# Patient Record
Sex: Female | Born: 1975 | Race: White | Hispanic: No | Marital: Married | Smoking: Former smoker
Health system: Southern US, Community
[De-identification: ages and names within clinical notes are randomized; demographics above are authoritative.]

## PROBLEM LIST (undated history)

## (undated) DIAGNOSIS — I10 Essential (primary) hypertension: Secondary | ICD-10-CM

## (undated) DIAGNOSIS — F419 Anxiety disorder, unspecified: Secondary | ICD-10-CM

## (undated) DIAGNOSIS — F32A Depression, unspecified: Secondary | ICD-10-CM

## (undated) DIAGNOSIS — K219 Gastro-esophageal reflux disease without esophagitis: Secondary | ICD-10-CM

## (undated) DIAGNOSIS — E538 Deficiency of other specified B group vitamins: Secondary | ICD-10-CM

## (undated) DIAGNOSIS — E785 Hyperlipidemia, unspecified: Secondary | ICD-10-CM

---

## 1993-02-26 HISTORY — PX: ECTOPIC PREGNANCY SURGERY: SHX613

## 1999-02-27 HISTORY — PX: TUBAL LIGATION: SHX77

## 2005-02-26 HISTORY — PX: LAPAROSCOPIC ABDOMINAL EXPLORATION: SHX6249

## 2011-02-27 HISTORY — PX: ENDOMETRIAL ABLATION: SHX621

## 2014-09-09 DIAGNOSIS — F32A Depression, unspecified: Secondary | ICD-10-CM | POA: Insufficient documentation

## 2016-05-29 ENCOUNTER — Other Ambulatory Visit: Payer: Self-pay | Admitting: Obstetrics & Gynecology

## 2016-05-29 DIAGNOSIS — Z1231 Encounter for screening mammogram for malignant neoplasm of breast: Secondary | ICD-10-CM

## 2016-06-20 ENCOUNTER — Ambulatory Visit
Admission: RE | Admit: 2016-06-20 | Discharge: 2016-06-20 | Disposition: A | Discharge status: Home / Self Care | Payer: BC Managed Care – PPO | Source: Ambulatory Visit | Attending: Obstetrics & Gynecology | Admitting: Obstetrics & Gynecology

## 2016-06-20 DIAGNOSIS — Z1231 Encounter for screening mammogram for malignant neoplasm of breast: Secondary | ICD-10-CM | POA: Diagnosis present

## 2016-06-21 ENCOUNTER — Encounter: Payer: Self-pay | Admitting: Obstetrics & Gynecology

## 2016-06-25 ENCOUNTER — Encounter: Payer: Self-pay | Admitting: Obstetrics & Gynecology

## 2016-06-25 ENCOUNTER — Ambulatory Visit (INDEPENDENT_AMBULATORY_CARE_PROVIDER_SITE_OTHER): Payer: BC Managed Care – PPO | Admitting: Obstetrics & Gynecology

## 2016-06-25 VITALS — BP 130/82 | HR 93 | Ht 66.0 in | Wt 192.0 lb

## 2016-06-25 DIAGNOSIS — N870 Mild cervical dysplasia: Secondary | ICD-10-CM

## 2016-06-25 NOTE — Patient Instructions (Signed)
Cervical Dysplasia Cervical dysplasia is a condition in which a woman's cervix cells have abnormal changes. The cervix is the opening of the uterus (womb). It is located between the vagina and the uterus. Cervical dysplasia may be an early sign of cervical cancer. If left untreated, this condition may become more severe and may progress to cervical cancer. Early detection, treatment, and follow-up care are very important. What are the causes? Cervical dysplasia can be caused by a human papillomavirus (HPV) infection. HPV is the most common sexually transmitted infection (STI). HPV is spread from person to person through sexual contact. This includes oral, vaginal, or anal sex. What increases the risk? The following factors may make you more likely to develop this condition:  Having had a sexually transmitted infection (STI), such as herpes, chlamydia or HPV.  Becoming sexually active before age 18.  Having had more than one sexual partner.  Having a sexual partner who has multiple sexual partners.  Not using protection, such as a condom, during sex, especially with new sexual partners.  Having a history of cancer of the vagina or vulva.  Having a weakened body defense (immune) system.  Being the daughter of a woman who took diethylstilbestrol (DES) during pregnancy.  Having a family history of cervical cancer.  Smoking.  Using oral contraceptives, also called birth control pills.  Having had three or more full-term pregnancies.  What are the signs or symptoms? There are usually no symptoms of this condition. If you do have symptoms, they may include:  Abnormal vaginal discharge.  Bleeding between periods or after sex.  Bleeding during menopause.  Pain during sex (dyspareunia).  How is this diagnosed? A test called a Pap test may be done. During this test, cells are taken from the cervix and then examined under a microscope. A test in which tissue is removed from the cervix  (biopsy) may also be done if the Pap test is abnormal or if the cervix looks abnormal. How is this treated? Treatment varies based on the severity of the condition. Treatment may include:  Cryotherapy. During cryotherapy, the abnormal cells are frozen with a steel-tip instrument.  Loop electrosurgical excision procedure (LEEP). This procedure removes abnormal tissue from the cervix.  Surgery to remove abnormal tissue. This is usually done in more advanced cases. Surgical options include: ? A cone biopsy. This is a procedure in which the cervical canal and a portion of the center of the cervix are removed. ? Hysterectomy. This is a surgery in which the uterus and cervix are removed.  Follow these instructions at home:  Take over-the-counter and prescription medicines only as told by your health care provider.  Do not use tampons, have sex, or douche until your health care provider says it is okay.  Keep follow-up visits as told by your health care provider. This is important. Women who have been treated for cervical dysplasia should have regular pelvic exams and Pap tests as told by their health care provider. How is this prevented?  Practice safe sex to help prevent sexually transmitted infections (STI) that may cause this condition.  Have regular Pap tests. Talk with your health care provider about how often you need these tests. Pap tests will help identify cell changes that can lead to cancer. Contact a health care provider if:  You develop genital warts. Get help right away if:  You have a fever.  You have abnormal vaginal discharge.  Your menstrual period is heavier than normal.  You develop bright   red bleeding. This may include blood clots.  You have pain or cramps that get worse, and medicine does not help to relieve your pain.  You feel light-headed and you are unusually weak.  You have fainting spells.  You have pain in the abdomen. Summary  Cervical dysplasia  is a condition in which a woman's cervix cells have abnormal changes.  If left untreated, this condition may become more severe and may progress to cervical cancer.  Early detection, treatment, and follow-up care are very important in managing this condition.  Have regular pelvic exams and Pap tests. Talk with your health care provider about how often you need these tests. Pap tests will help identify cell changes that can lead to cancer. This information is not intended to replace advice given to you by your health care provider. Make sure you discuss any questions you have with your health care provider. Document Released: 02/12/2005 Document Revised: 02/16/2016 Document Reviewed: 02/16/2016 Elsevier Interactive Patient Education  2017 [employer]  

## 2016-06-25 NOTE — Progress Notes (Signed)
  HPI:  Patient is a 41 y.o. No obstetric history on file. presenting for follow up evaluation of abnormal PAP smear in the past.  Her last PAP was 6 months ago and was abnormal: LGSIL. She has had a prior colposcopy. 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  PMHx: She  has no past medical history on file. Also,  has no past surgical history on file., family history includes Breast cancer in her paternal grandmother; Prostate cancer in her father.,  reports that she has never smoked. She has never used smokeless tobacco. She reports that she does not drink alcohol or use drugs.  She currently has no medications in their medication list. Also, is allergic to sulfa antibiotics.  Review of Systems  Constitutional: Negative for chills, fever and malaise/fatigue.  HENT: Negative for congestion, sinus pain and sore throat.   Eyes: Negative for blurred vision and pain.  Respiratory: Negative for cough and wheezing.   Cardiovascular: Negative for chest pain and leg swelling.  Gastrointestinal: Negative for abdominal pain, constipation, diarrhea, heartburn, nausea and vomiting.  Genitourinary: Negative for dysuria, frequency, hematuria and urgency.  Musculoskeletal: Negative for back pain, joint pain, myalgias and neck pain.  Skin: Negative for itching and rash.  Neurological: Negative for dizziness, tremors and weakness.  Endo/Heme/Allergies: Does not bruise/bleed easily.  Psychiatric/Behavioral: Negative for depression. The patient is not nervous/anxious and does not have insomnia.     Objective: BP 130/82 (BP Location: Right Arm, Patient Position: Sitting, Cuff Size: Normal)   Pulse 93   Ht 5\' 6"  (1.676 m)   Wt 192 lb (87.1 kg)   BMI 30.99 kg/m  Filed Weights   06/25/16 1628  Weight: 192 lb (87.1 kg)   Body mass index is 30.99 kg/m.  Physical examination Physical Exam  Constitutional: She is oriented to person, place, and time. She appears well-developed and well-nourished. No distress.    Genitourinary: Vagina normal and uterus normal. Pelvic exam was performed with patient supine. There is no rash, tenderness or lesion on the right labia. There is no rash, tenderness or lesion on the left labia. No erythema or bleeding in the vagina. Right adnexum does not display mass and does not display tenderness. Left adnexum does not display mass and does not display tenderness. Cervix does not exhibit motion tenderness, discharge, polyp or nabothian cyst.   Uterus is mobile and midaxial. Uterus is not enlarged or exhibiting a mass.  Abdominal: Soft. She exhibits no distension. There is no tenderness.  Musculoskeletal: Normal range of motion.  Neurological: She is alert and oriented to person, place, and time. No cranial nerve deficit.  Skin: Skin is warm and dry.  Psychiatric: She has a normal mood and affect.    ASSESSMENT:  History of Cervical Dysplasia  Plan:  1.  I discussed the grading system of pap smears and HPV high risk viral types.   2. Follow up PAP 6 months, vs intervention if high grade dysplasia identified. 3. Treatment of persistantly abnormal PAP smears and cervical dysplasia, even mild, is discussed w pt today in detail, as well as the pros and cons of Cryo and LEEP procedures. Will consider and discuss after results.   Barnett Applebaum, MD, [facility], [facility] 06/25/2016  4:47 PM

## 2016-06-29 LAB — IGP, RFX APTIMA HPV ASCU: PAP Smear Comment: 0

## 2017-02-26 DIAGNOSIS — R7303 Prediabetes: Secondary | ICD-10-CM

## 2017-02-26 HISTORY — DX: Prediabetes: R73.03

## 2017-05-08 ENCOUNTER — Other Ambulatory Visit: Payer: Self-pay | Admitting: Obstetrics & Gynecology

## 2017-05-08 DIAGNOSIS — Z1239 Encounter for other screening for malignant neoplasm of breast: Secondary | ICD-10-CM

## 2017-06-21 ENCOUNTER — Ambulatory Visit
Admission: RE | Admit: 2017-06-21 | Discharge: 2017-06-21 | Disposition: A | Discharge status: Home / Self Care | Payer: BC Managed Care – PPO | Source: Ambulatory Visit | Attending: Obstetrics & Gynecology | Admitting: Obstetrics & Gynecology

## 2017-06-21 DIAGNOSIS — Z1239 Encounter for other screening for malignant neoplasm of breast: Secondary | ICD-10-CM

## 2017-06-21 DIAGNOSIS — Z1231 Encounter for screening mammogram for malignant neoplasm of breast: Secondary | ICD-10-CM | POA: Insufficient documentation

## 2017-07-30 ENCOUNTER — Other Ambulatory Visit: Payer: Self-pay | Admitting: Obstetrics and Gynecology

## 2018-05-08 ENCOUNTER — Other Ambulatory Visit: Payer: Self-pay | Admitting: Student

## 2018-05-08 ENCOUNTER — Other Ambulatory Visit: Payer: Self-pay | Admitting: Family Medicine

## 2018-05-08 DIAGNOSIS — Z1231 Encounter for screening mammogram for malignant neoplasm of breast: Secondary | ICD-10-CM

## 2018-10-08 ENCOUNTER — Ambulatory Visit
Admission: RE | Admit: 2018-10-08 | Discharge: 2018-10-08 | Disposition: A | Discharge status: Home / Self Care | Payer: BC Managed Care – PPO | Source: Ambulatory Visit | Attending: Student | Admitting: Student

## 2018-10-08 ENCOUNTER — Other Ambulatory Visit: Payer: Self-pay

## 2018-10-08 DIAGNOSIS — Z1231 Encounter for screening mammogram for malignant neoplasm of breast: Secondary | ICD-10-CM | POA: Diagnosis not present

## 2018-11-10 ENCOUNTER — Telehealth: Payer: Self-pay | Admitting: Obstetrics & Gynecology

## 2018-11-10 NOTE — Telephone Encounter (Signed)
Received records from [facility] called and left message for patient to call the office back to make appointment.

## 2018-11-20 ENCOUNTER — Ambulatory Visit: Payer: BC Managed Care – PPO | Admitting: Obstetrics & Gynecology

## 2018-11-21 NOTE — Telephone Encounter (Signed)
Patient cancelled appointment schedule for 11/20/18. Called and left voicemail for patient to call back to be reschedule. Paper records

## 2018-11-24 NOTE — Telephone Encounter (Signed)
Called and left voice mail for patient to call back to be schedule °

## 2019-10-27 DIAGNOSIS — I10 Essential (primary) hypertension: Secondary | ICD-10-CM | POA: Insufficient documentation

## 2019-10-27 DIAGNOSIS — K219 Gastro-esophageal reflux disease without esophagitis: Secondary | ICD-10-CM | POA: Insufficient documentation

## 2019-11-09 ENCOUNTER — Other Ambulatory Visit: Payer: Self-pay | Admitting: Family Medicine

## 2019-11-09 DIAGNOSIS — Z1231 Encounter for screening mammogram for malignant neoplasm of breast: Secondary | ICD-10-CM

## 2019-11-25 ENCOUNTER — Ambulatory Visit
Admission: RE | Admit: 2019-11-25 | Discharge: 2019-11-25 | Disposition: A | Discharge status: Home / Self Care | Payer: BC Managed Care – PPO | Source: Ambulatory Visit | Attending: Family Medicine | Admitting: Family Medicine

## 2019-11-25 ENCOUNTER — Other Ambulatory Visit: Payer: Self-pay

## 2019-11-25 DIAGNOSIS — Z1231 Encounter for screening mammogram for malignant neoplasm of breast: Secondary | ICD-10-CM | POA: Diagnosis present

## 2019-12-09 ENCOUNTER — Other Ambulatory Visit (HOSPITAL_COMMUNITY)
Admission: RE | Admit: 2019-12-09 | Discharge: 2019-12-09 | Disposition: A | Discharge status: Home / Self Care | Payer: BC Managed Care – PPO | Source: Ambulatory Visit | Attending: Obstetrics & Gynecology | Admitting: Obstetrics & Gynecology

## 2019-12-09 ENCOUNTER — Other Ambulatory Visit: Payer: Self-pay

## 2019-12-09 ENCOUNTER — Ambulatory Visit (INDEPENDENT_AMBULATORY_CARE_PROVIDER_SITE_OTHER): Payer: BC Managed Care – PPO | Admitting: Obstetrics & Gynecology

## 2019-12-09 ENCOUNTER — Encounter: Payer: Self-pay | Admitting: Obstetrics & Gynecology

## 2019-12-09 VITALS — BP 138/80 | Ht 66.0 in | Wt 227.0 lb

## 2019-12-09 DIAGNOSIS — Z01419 Encounter for gynecological examination (general) (routine) without abnormal findings: Secondary | ICD-10-CM

## 2019-12-09 DIAGNOSIS — Z124 Encounter for screening for malignant neoplasm of cervix: Secondary | ICD-10-CM | POA: Diagnosis not present

## 2019-12-09 DIAGNOSIS — Z23 Encounter for immunization: Secondary | ICD-10-CM | POA: Diagnosis not present

## 2019-12-09 MED ORDER — NORETHINDRONE ACET-ETHINYL EST 1.5-30 MG-MCG PO TABS: 1.0000 | ORAL_TABLET | Freq: Every day | ORAL | 4 refills | Status: DC

## 2019-12-09 NOTE — Progress Notes (Signed)
HPI:      Ms. Rebecca Phelps is a 44 y.o. [id number] who LMP was No LMP recorded. (Menstrual status: Oral contraceptives)., she presents today for her annual examination. The patient has no complaints today other than continued severe PMS and menorrhagia when not on cont dosing OCPs; prior ablation no help. The patient is sexually active. 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 The patient does perform self breast exams.  There is no notable family history of breast or ovarian cancer in her family.  The patient has regular exercise: yes.  The patient denies current symptoms of depression.    GYN History: Contraception: tubal ligation  Prior endometriosis Prior ectopic Prior CS x2 Prior endometrial ablation  PMHx: History reviewed. No pertinent past medical history. History reviewed. No pertinent surgical history. Family History  Problem Relation Age of Onset  . Breast cancer Paternal Grandmother   . Prostate cancer Father    Social History   Tobacco Use  . Smoking status: Never Smoker  . Smokeless tobacco: Never Used  Vaping Use  . Vaping Use: Never used  Substance Use Topics  . Alcohol use: No  . Drug use: No    Current Outpatient Medications:  .  metoprolol succinate (TOPROL-XL) 25 MG 24 hr tablet, Take by mouth., Disp: , Rfl:  .  Norethindrone Acetate-Ethinyl Estradiol (JUNEL 1.5/30) 1.5-30 MG-MCG tablet, Take by mouth., Disp: , Rfl:  Allergies: Sulfa antibiotics  Review of Systems  Constitutional: Positive for malaise/fatigue. Negative for chills and fever.  HENT: Negative for congestion, sinus pain and sore throat.   Eyes: Negative for blurred vision and pain.  Respiratory: Negative for cough and wheezing.   Cardiovascular: Negative for chest pain and leg swelling.  Gastrointestinal: Negative for abdominal pain, constipation, diarrhea,  heartburn, nausea and vomiting.  Genitourinary: Negative for dysuria, frequency, hematuria and urgency.  Musculoskeletal: Negative for back pain, joint pain, myalgias and neck pain.  Skin: Negative for itching and rash.  Neurological: Negative for dizziness, tremors and weakness.  Endo/Heme/Allergies: Does not bruise/bleed easily.  Psychiatric/Behavioral: Negative for depression. The patient is nervous/anxious. The patient does not have insomnia.     Objective: BP 138/80   Ht 5\' 6"  (1.676 m)   Wt 227 lb (103 kg)   BMI 36.64 kg/m   Filed Weights   12/09/19 1439  Weight: 227 lb (103 kg)   Body mass index is 36.64 kg/m. Physical Exam Constitutional:      General: She is not in acute distress.    Appearance: She is well-developed.  Genitourinary:     Pelvic exam was performed with patient supine.     Vagina, uterus and rectum normal.     No lesions in the vagina.     No vaginal bleeding.     No cervical motion tenderness, friability, lesion or polyp.     Uterus is mobile.     Uterus is not enlarged.     No uterine mass detected.    Uterus is midaxial.     No right or left adnexal mass present.     Right adnexa not tender.     Left adnexa not tender.  HENT:     Head: Normocephalic and atraumatic. No laceration.     Right Ear: Hearing normal.     Left Ear: Hearing normal.     Mouth/Throat:  Pharynx: Uvula midline.  Eyes:     Pupils: Pupils are equal, round, and reactive to light.  Neck:     Thyroid: No thyromegaly.  Cardiovascular:     Rate and Rhythm: Normal rate and regular rhythm.     Heart sounds: No murmur heard.  No friction rub. No gallop.   Pulmonary:     Effort: Pulmonary effort is normal. No respiratory distress.     Breath sounds: Normal breath sounds. No wheezing.  Chest:     Breasts:        Right: No mass, skin change or tenderness.        Left: No mass, skin change or tenderness.  Abdominal:     General: Bowel sounds are normal. There is no  distension.     Palpations: Abdomen is soft.     Tenderness: There is no abdominal tenderness. There is no rebound.  Musculoskeletal:        General: Normal range of motion.     Cervical back: Normal range of motion and neck supple.  Neurological:     Mental Status: She is alert and oriented to person, place, and time.     Cranial Nerves: No cranial nerve deficit.  Skin:    General: Skin is warm and dry.  Psychiatric:        Judgment: Judgment normal.  Vitals reviewed.     Assessment:  ANNUAL EXAM 1. Women's annual routine gynecological examination   2. Screening for cervical cancer      Screening Plan:            1.  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  Pt has severe menorrhagia (s/p ablation), s/p lap for endometriosis, prior CS x2, prior ectopic, and h/o severe PMS to point f taking continuous suppressive hormones for therapy; also desires hysterectomy to combat these sx's.  2. Breast screening- Exam annually and mammogram>40 planned   3. Colonoscopy every 10 years, Hemoccult testing - after age 86  4. Labs managed by PCP     F/U  Return in about 1 year (around 12/08/2020) for Annual.  Barnett Applebaum, MD, [facility], [facility] 12/09/2019  3:21 PM

## 2019-12-09 NOTE — Patient Instructions (Signed)
Thank you for choosing [facility]. As part of our ongoing efforts to improve patient experience, we would appreciate your feedback. Please fill out the short survey that you will receive by mail or MyChart. Your opinion is important to Rebecca Phelps! - Dr. Kenton Kingfisher   Total Laparoscopic Hysterectomy A total laparoscopic hysterectomy is a minimally invasive surgery to remove the uterus and cervix. The fallopian tubes and ovaries can also be removed (bilateral salpingo-oophorectomy) during this surgery, if necessary. This procedure may be done to treat problems such as:  Noncancerous growths in the uterus (uterine fibroids) that cause symptoms.  A condition that causes the lining of the uterus (endometrium) to grow in other areas (endometriosis).  Problems with pelvic support. This is caused by weakened muscles of the pelvis following vaginal childbirth or menopause.  Cancer of the cervix, ovaries, uterus, or endometrium.  Excessive (dysfunctional) uterine bleeding. This surgery is performed by inserting a thin, lighted tube (laparoscope) and surgical instruments into small incisions in the abdomen. The laparoscope sends images to a monitor. The images help the health care provider perform the procedure. After this procedure, you will no longer be able to have a baby, and you will no longer have a menstrual period. Tell a health care provider about:  Any allergies you have.  All medicines you are taking, including vitamins, herbs, eye drops, creams, and over-the-counter medicines.  Any problems you or family members have had with anesthetic medicines.  Any blood disorders you have.  Any surgeries you have had.  Any medical conditions you have.  Whether you are pregnant or may be pregnant. What are the risks? Generally, this is a safe procedure. However, problems may occur, including:  Infection.  Bleeding.  Blood clots in the legs or lungs.  Allergic reactions to medicines.  Damage to  other structures or organs.  The risk that the surgery may have to be switched to the regular one in which a large incision is made in the abdomen (abdominal hysterectomy). What happens before the procedure? Staying hydrated Follow instructions from your health care provider about hydration, which may include:  Up to 2 hours before the procedure - you may continue to drink clear liquids, such as water, clear fruit juice, black coffee, and plain tea Eating and drinking restrictions Follow instructions from your health care provider about eating and drinking, which may include:  8 hours before the procedure - stop eating heavy meals or foods such as meat, fried foods, or fatty foods.  6 hours before the procedure - stop eating light meals or foods, such as toast or cereal.  6 hours before the procedure - stop drinking milk or drinks that contain milk.  2 hours before the procedure - stop drinking clear liquids. Medicines  Ask your health care provider about: ? Changing or stopping your regular medicines. This is especially important if you are taking diabetes medicines or blood thinners. ? Taking over-the-counter medicines, vitamins, herbs, and supplements. ? Taking medicines such as aspirin and ibuprofen. These medicines can thin your blood. Do not take these medicines unless your health care provider tells you to take them.  You may be given antibiotic medicine to help prevent infection.  You may be asked to take laxatives.  You may be given medicines to help prevent nausea and vomiting after the procedure. General instructions  Ask your health care provider how your surgical site will be marked or identified.  You may be asked to shower with a germ-killing soap.  Do  not use any products that contain nicotine or tobacco, such as cigarettes and e-cigarettes. If you need help quitting, ask your health care provider.  You may have an exam or testing, such as an ultrasound to  determine the size and shape of your pelvic organs.  You may have a blood or urine sample taken.  This procedure can affect the way you feel about yourself. Talk with your health care provider about the physical and emotional changes hysterectomy may cause.  Plan to have someone take you home from the hospital or clinic.  Plan to have a responsible adult care for you for at least 24 hours after you leave the hospital or clinic. This is important. What happens during the procedure?  To lower your risk of infection: ? Your health care team will wash or sanitize their hands. ? Your skin will be washed with soap. ? Hair may be removed from the surgical area.  An IV will be inserted into one of your veins.  You will be given one or more of the following: ? A medicine to help you relax (sedative). ? A medicine to make you fall asleep (general anesthetic).  You will be given antibiotic medicine through your IV.  A tube may be inserted down your throat to help you breathe during the procedure.  A gas (carbon dioxide) will be used to inflate your abdomen to allow your surgeon to see inside of your abdomen.  Three or four small incisions will be made in your abdomen.  A laparoscope will be inserted into one of your incisions. Surgical instruments will be inserted through the other incisions in order to perform the procedure.  Your uterus and cervix may be removed through your vagina or cut into small pieces and removed through the small incisions. Any other organs that need to be removed will also be removed this way.  Carbon dioxide will be released from inside of your abdomen.  Your incisions will be closed with stitches (sutures).  A bandage (dressing) may be placed over your incisions. The procedure may vary among health care providers and hospitals. What happens after the procedure?  Your blood pressure, heart rate, breathing rate, and blood oxygen level will be monitored until  the medicines you were given have worn off.  You will be given medicine for pain and nausea as needed.  Do not drive for 24 hours if you received a sedative. Summary  Total Laparoscopic hysterectomy is a procedure to remove your uterus, cervix and sometimes the fallopian tubes and ovaries.  This procedure can affect the way you feel about yourself. Talk with your health care provider about the physical and emotional changes hysterectomy may cause.  After this procedure, you will no longer be able to have a baby, and you will no longer have a menstrual period.  You will be given pain medicine to control discomfort after this procedure. This information is not intended to replace advice given to you by your health care provider. Make sure you discuss any questions you have with your health care provider. Document Revised: 01/25/2017 Document Reviewed: 04/25/2016 Elsevier Patient Education  2020 [employer].

## 2019-12-14 LAB — CYTOLOGY - PAP: Diagnosis: UNDETERMINED — AB

## 2019-12-15 ENCOUNTER — Telehealth: Payer: Self-pay

## 2019-12-15 ENCOUNTER — Other Ambulatory Visit: Payer: Self-pay | Admitting: Obstetrics & Gynecology

## 2019-12-15 DIAGNOSIS — N920 Excessive and frequent menstruation with regular cycle: Secondary | ICD-10-CM

## 2019-12-15 DIAGNOSIS — N809 Endometriosis, unspecified: Secondary | ICD-10-CM

## 2019-12-15 DIAGNOSIS — N943 Premenstrual tension syndrome: Secondary | ICD-10-CM

## 2019-12-15 NOTE — Telephone Encounter (Signed)
Your surgery request indicates the following: Medical Clearance: Yes Special Scheduling Instructions: Yes  I did not see anything in the clinic notes that mentioned the above. Please advise.

## 2019-12-15 NOTE — Telephone Encounter (Signed)
Pt would like a call to discuss her next steps since her pap was abnormal. she's aware you are out of the office today and will call Thursday or when you get results.

## 2020-01-04 NOTE — Telephone Encounter (Signed)
Pt called me back to schedule TLH/BS w Kaylyn Layer 02/11/20  H&P 12/1 @ 3:30   Covid testing 12/14 @ 8-10:30, Medical [address] drive up and wear mask. Advised pt to quarantine until DOS.  Pre-admit phone call appointment 12/7 @ 1-5pm  Advised that pt may also receive calls from the hospital pharmacy and pre-service center.  Confirmed pt has [employer] as Chartered certified accountant. No secondary insurance.  Pt adv that she has married and her new last name is "Vallee" and asked that I update her account.

## 2020-01-05 ENCOUNTER — Telehealth: Payer: Self-pay | Admitting: Obstetrics & Gynecology

## 2020-01-05 NOTE — Telephone Encounter (Signed)
Sent mychart msg to pt

## 2020-01-27 ENCOUNTER — Encounter: Payer: Self-pay | Admitting: Obstetrics & Gynecology

## 2020-01-27 ENCOUNTER — Other Ambulatory Visit: Payer: Self-pay

## 2020-01-27 ENCOUNTER — Ambulatory Visit (INDEPENDENT_AMBULATORY_CARE_PROVIDER_SITE_OTHER): Payer: BC Managed Care – PPO | Admitting: Obstetrics & Gynecology

## 2020-01-27 VITALS — BP 120/80 | Ht 66.0 in | Wt 237.0 lb

## 2020-01-27 DIAGNOSIS — N879 Dysplasia of cervix uteri, unspecified: Secondary | ICD-10-CM

## 2020-01-27 DIAGNOSIS — N809 Endometriosis, unspecified: Secondary | ICD-10-CM

## 2020-01-27 DIAGNOSIS — N920 Excessive and frequent menstruation with regular cycle: Secondary | ICD-10-CM

## 2020-01-27 NOTE — Progress Notes (Signed)
PRE-OPERATIVE HISTORY AND PHYSICAL EXAM  HPI:  Rebecca Phelps is a 44 y.o. [id number] No LMP recorded. (Menstrual status: Oral contraceptives).; she is being admitted for surgery related to menorrhagia, endometriosis, cervical dysplasia.  She has continued with severe PMS and menorrhagia when not on cont dosing OCPs; prior ablation no help.  She has had prior CS x2, ablation, surgery for ectopic, and tubal ligation.  PMHx: History reviewed. Endometriosis. History reviewed. Cesarean.  Cesarean. Tubal. Surgery for Ectopic.  Endometrial ablation. Family History  Problem Relation Age of Onset  . Breast cancer Paternal Grandmother   . Prostate cancer Father    Social History   Tobacco Use  . Smoking status: Never Smoker  . Smokeless tobacco: Never Used  Vaping Use  . Vaping Use: Never used  Substance Use Topics  . Alcohol use: No  . Drug use: No    Current Outpatient Medications:  .  metoprolol succinate (TOPROL-XL) 25 MG 24 hr tablet, Take by mouth., Disp: , Rfl:  .  Norethindrone Acetate-Ethinyl Estradiol (JUNEL 1.5/30) 1.5-30 MG-MCG tablet, Take 1 tablet by mouth daily. Continuous dosing, Disp: 84 tablet, Rfl: 4 .  omeprazole (PRILOSEC) 20 MG capsule, Take by mouth., Disp: , Rfl:  Allergies: Sulfa antibiotics  Review of Systems  Constitutional: Negative for chills, fever and malaise/fatigue.  HENT: Negative for congestion, sinus pain and sore throat.   Eyes: Negative for blurred vision and pain.  Respiratory: Negative for cough and wheezing.   Cardiovascular: Negative for chest pain and leg swelling.  Gastrointestinal: Negative for abdominal pain, constipation, diarrhea, heartburn, nausea and vomiting.  Genitourinary: Negative for dysuria, frequency, hematuria and urgency.  Musculoskeletal: Negative for back pain, joint pain, myalgias and neck pain.  Skin: Negative for itching and rash.  Neurological: Negative for dizziness, tremors and weakness.  Endo/Heme/Allergies: Does not  bruise/bleed easily.  Psychiatric/Behavioral: Negative for depression. The patient is not nervous/anxious and does not have insomnia.     Objective: BP 120/80   Ht 5\' 6"  (1.676 m)   Wt 237 lb (107.5 kg)   BMI 38.25 kg/m   Filed Weights   01/27/20 1513  Weight: 237 lb (107.5 kg)   Physical Exam Constitutional:      General: She is not in acute distress.    Appearance: She is well-developed.  HENT:     Head: Normocephalic and atraumatic. No laceration.     Right Ear: Hearing normal.     Left Ear: Hearing normal.     Mouth/Throat:     Pharynx: Uvula midline.  Eyes:     Pupils: Pupils are equal, round, and reactive to light.  Neck:     Thyroid: No thyromegaly.  Cardiovascular:     Rate and Rhythm: Normal rate and regular rhythm.     Heart sounds: No murmur heard.  No friction rub. No gallop.   Pulmonary:     Effort: Pulmonary effort is normal. No respiratory distress.     Breath sounds: Normal breath sounds. No wheezing.  Chest:     Breasts:        Right: No mass, skin change or tenderness.        Left: No mass, skin change or tenderness.  Abdominal:     General: Bowel sounds are normal. There is no distension.     Palpations: Abdomen is soft.     Tenderness: There is no abdominal tenderness. There is no rebound.  Musculoskeletal:        General: Normal range  of motion.     Cervical back: Normal range of motion and neck supple.  Neurological:     Mental Status: She is alert and oriented to person, place, and time.     Cranial Nerves: No cranial nerve deficit.  Skin:    General: Skin is warm and dry.  Psychiatric:        Judgment: Judgment normal.  Vitals reviewed.     Assessment: 1. Menorrhagia with regular cycle   2. Endometriosis   3. Cervical dysplasia   Options discussed. Plan TLH BS.  Preservation of ovaries discussed, benefits exceed risks (endometriosis persistence, cancer risks).  I have had a careful discussion with this patient about all the  options available and the risk/benefits of each. I have fully informed this patient that surgery may subject her to a variety of discomforts and risks: She understands that most patients have surgery with little difficulty, but problems can happen ranging from minor to fatal. These include nausea, vomiting, pain, bleeding, infection, poor healing, hernia, or formation of adhesions. Unexpected reactions may occur from any drug or anesthetic given. Unintended injury may occur to other pelvic or abdominal structures such as Fallopian tubes, ovaries, bladder, ureter (tube from kidney to bladder), or bowel. Nerves going from the pelvis to the legs may be injured. Any such injury may require immediate or later additional surgery to correct the problem. Excessive blood loss requiring transfusion is very unlikely but possible. Dangerous blood clots may form in the legs or lungs. Physical and sexual activity will be restricted in varying degrees for an indeterminate period of time but most often 2-6 weeks.  Finally, she understands that it is impossible to list every possible undesirable effect and that the condition for which surgery is done is not always cured or significantly improved, and in rare cases may be even worse.Ample time was given to answer all questions.  Barnett Applebaum, MD, [facility], [facility] 01/27/2020  3:30 PM

## 2020-01-27 NOTE — Patient Instructions (Signed)
PRE ADMISSION TESTING For Covid, prior to procedure Tuesday 9:00-10:00 [facility] entrance (drive up)  Results in 48-72 hours You will not receive notification if test results are negative. If positive for Covid19, your provider will notify you by phone, with additional instructions.   Total Laparoscopic Hysterectomy, Care After This sheet gives you information about how to care for yourself after your procedure. Your health care provider may also give you more specific instructions. If you have problems or questions, contact your health care provider. What can I expect after the procedure? After the procedure, it is common to have:  Pain and bruising around your incisions.  A sore throat, if a breathing tube was used during surgery.  Fatigue.  Poor appetite.  Less interest in sex. If your ovaries were also removed, it is also common to have symptoms of menopause such as hot flashes, night sweats, and lack of sleep (insomnia). Follow these instructions at home: Bathing  Do not take baths, swim, or use a hot tub until your health care provider approves. You may need to only take showers for 2-3 weeks.  Keep your bandage (dressing) dry until your health care provider says it can be removed. Incision care   Follow instructions from your health care provider about how to take care of your incisions. Make sure you: ? Wash your hands with soap and water before you change your dressing. If soap and water are not available, use hand sanitizer. ? Change your dressing as told by your health care provider. ? Leave stitches (sutures), skin glue, or adhesive strips in place. These skin closures may need to stay in place for 2 weeks or longer. If adhesive strip edges start to loosen and curl up, you may trim the loose edges. Do not remove adhesive strips completely unless your health care provider tells you to do that.  Check your incision area every day for signs of infection.  Check for: ? Redness, swelling, or pain. ? Fluid or blood. ? Warmth. ? Pus or a bad smell. Activity  Get plenty of rest and sleep.  Do not lift anything that is heavier than 10 lbs (4.5 kg) for one month after surgery, or as long as told by your health care provider.  Do not drive or use heavy machinery while taking prescription pain medicine.  Do not drive for 24 hours if you were given a medicine to help you relax (sedative).  Return to your normal activities as told by your health care provider. Ask your health care provider what activities are safe for you. Lifestyle   Do not use any products that contain nicotine or tobacco, such as cigarettes and e-cigarettes. These can delay healing. If you need help quitting, ask your health care provider.  Do not drink alcohol until your health care provider approves. General instructions  Do not douche, use tampons, or have sex for at least 6 weeks, or as told by your health care provider.  Take over-the-counter and prescription medicines only as told by your health care provider.  To monitor yourself for a fever, take your temperature at least once a day during recovery.  If you struggle with physical or emotional changes after your procedure, speak with your health care provider or a therapist.  To prevent or treat constipation while you are taking prescription pain medicine, your health care provider may recommend that you: ? Drink enough fluid to keep your urine clear or pale yellow. ? Take over-the-counter or prescription medicines. ?   Eat foods that are high in fiber, such as fresh fruits and vegetables, whole grains, and beans. ? Limit foods that are high in fat and processed sugars, such as fried and sweet foods.  Keep all follow-up visits as told by your health care provider. This is important. Contact a health care provider if:  You have chills or a fever.  You have redness, swelling, or pain around an incision.  You  have fluid or blood coming from an incision.  Your incision feels warm to the touch.  You have pus or a bad smell coming from an incision.  An incision breaks open.  You feel dizzy or light-headed.  You have pain or bleeding when you urinate.  You have diarrhea, nausea, or vomiting that does not go away.  You have abnormal vaginal discharge.  You have a rash.  You have pain that does not get better with medicine. Get help right away if:  You have a fever and your symptoms suddenly get worse.  You have severe abdominal pain.  You have chest pain.  You have shortness of breath.  You faint.  You have pain, swelling, or redness on your leg.  You have heavy vaginal bleeding with blood clots. Summary  After the procedure it is common to have abdominal pain. Your provider will give you medication for this.  Do not take baths, swim, or use a hot tub until your health care provider approves.  Do not lift anything that is heavier than 10 lbs (4.5 kg) for one month after surgery, or as long as told by your health care provider.  Notify your provider if you have any signs or symptoms of infection after the procedure. This information is not intended to replace advice given to you by your health care provider. Make sure you discuss any questions you have with your health care provider. Document Revised: 01/25/2017 Document Reviewed: 04/25/2016 Elsevier Patient Education  2020 [employer]  

## 2020-01-27 NOTE — H&P (View-Only) (Signed)
PRE-OPERATIVE HISTORY AND PHYSICAL EXAM  HPI:  Rebecca Phelps is a 44 y.o. [id number] No LMP recorded. (Menstrual status: Oral contraceptives).; she is being admitted for surgery related to menorrhagia, endometriosis, cervical dysplasia.  She has continued with severe PMS and menorrhagia when not on cont dosing OCPs; prior ablation no help.  She has had prior CS x2, ablation, surgery for ectopic, and tubal ligation.  PMHx: History reviewed. Endometriosis. History reviewed. Cesarean.  Cesarean. Tubal. Surgery for Ectopic.  Endometrial ablation. Family History  Problem Relation Age of Onset  . Breast cancer Paternal Grandmother   . Prostate cancer Father    Social History   Tobacco Use  . Smoking status: Never Smoker  . Smokeless tobacco: Never Used  Vaping Use  . Vaping Use: Never used  Substance Use Topics  . Alcohol use: No  . Drug use: No    Current Outpatient Medications:  .  metoprolol succinate (TOPROL-XL) 25 MG 24 hr tablet, Take by mouth., Disp: , Rfl:  .  Norethindrone Acetate-Ethinyl Estradiol (JUNEL 1.5/30) 1.5-30 MG-MCG tablet, Take 1 tablet by mouth daily. Continuous dosing, Disp: 84 tablet, Rfl: 4 .  omeprazole (PRILOSEC) 20 MG capsule, Take by mouth., Disp: , Rfl:  Allergies: Sulfa antibiotics  Review of Systems  Constitutional: Negative for chills, fever and malaise/fatigue.  HENT: Negative for congestion, sinus pain and sore throat.   Eyes: Negative for blurred vision and pain.  Respiratory: Negative for cough and wheezing.   Cardiovascular: Negative for chest pain and leg swelling.  Gastrointestinal: Negative for abdominal pain, constipation, diarrhea, heartburn, nausea and vomiting.  Genitourinary: Negative for dysuria, frequency, hematuria and urgency.  Musculoskeletal: Negative for back pain, joint pain, myalgias and neck pain.  Skin: Negative for itching and rash.  Neurological: Negative for dizziness, tremors and weakness.  Endo/Heme/Allergies: Does not  bruise/bleed easily.  Psychiatric/Behavioral: Negative for depression. The patient is not nervous/anxious and does not have insomnia.     Objective: BP 120/80   Ht 5\' 6"  (1.676 m)   Wt 237 lb (107.5 kg)   BMI 38.25 kg/m   Filed Weights   01/27/20 1513  Weight: 237 lb (107.5 kg)   Physical Exam Constitutional:      General: She is not in acute distress.    Appearance: She is well-developed.  HENT:     Head: Normocephalic and atraumatic. No laceration.     Right Ear: Hearing normal.     Left Ear: Hearing normal.     Mouth/Throat:     Pharynx: Uvula midline.  Eyes:     Pupils: Pupils are equal, round, and reactive to light.  Neck:     Thyroid: No thyromegaly.  Cardiovascular:     Rate and Rhythm: Normal rate and regular rhythm.     Heart sounds: No murmur heard.  No friction rub. No gallop.   Pulmonary:     Effort: Pulmonary effort is normal. No respiratory distress.     Breath sounds: Normal breath sounds. No wheezing.  Chest:     Breasts:        Right: No mass, skin change or tenderness.        Left: No mass, skin change or tenderness.  Abdominal:     General: Bowel sounds are normal. There is no distension.     Palpations: Abdomen is soft.     Tenderness: There is no abdominal tenderness. There is no rebound.  Musculoskeletal:        General: Normal range  of motion.     Cervical back: Normal range of motion and neck supple.  Neurological:     Mental Status: She is alert and oriented to person, place, and time.     Cranial Nerves: No cranial nerve deficit.  Skin:    General: Skin is warm and dry.  Psychiatric:        Judgment: Judgment normal.  Vitals reviewed.     Assessment: 1. Menorrhagia with regular cycle   2. Endometriosis   3. Cervical dysplasia   Options discussed. Plan TLH BS.  Preservation of ovaries discussed, benefits exceed risks (endometriosis persistence, cancer risks).  I have had a careful discussion with this patient about all the  options available and the risk/benefits of each. I have fully informed this patient that surgery may subject her to a variety of discomforts and risks: She understands that most patients have surgery with little difficulty, but problems can happen ranging from minor to fatal. These include nausea, vomiting, pain, bleeding, infection, poor healing, hernia, or formation of adhesions. Unexpected reactions may occur from any drug or anesthetic given. Unintended injury may occur to other pelvic or abdominal structures such as Fallopian tubes, ovaries, bladder, ureter (tube from kidney to bladder), or bowel. Nerves going from the pelvis to the legs may be injured. Any such injury may require immediate or later additional surgery to correct the problem. Excessive blood loss requiring transfusion is very unlikely but possible. Dangerous blood clots may form in the legs or lungs. Physical and sexual activity will be restricted in varying degrees for an indeterminate period of time but most often 2-6 weeks.  Finally, she understands that it is impossible to list every possible undesirable effect and that the condition for which surgery is done is not always cured or significantly improved, and in rare cases may be even worse.Ample time was given to answer all questions.  Barnett Applebaum, MD, [facility], [facility] 01/27/2020  3:30 PM

## 2020-02-02 ENCOUNTER — Other Ambulatory Visit: Payer: Self-pay

## 2020-02-02 ENCOUNTER — Encounter
Admission: RE | Admit: 2020-02-02 | Discharge: 2020-02-02 | Disposition: A | Discharge status: Home / Self Care | Payer: BC Managed Care – PPO | Source: Ambulatory Visit | Attending: Obstetrics & Gynecology | Admitting: Obstetrics & Gynecology

## 2020-02-02 HISTORY — DX: Hyperlipidemia, unspecified: E78.5

## 2020-02-02 HISTORY — DX: Depression, unspecified: F32.A

## 2020-02-02 HISTORY — DX: Anxiety disorder, unspecified: F41.9

## 2020-02-02 HISTORY — DX: Essential (primary) hypertension: I10

## 2020-02-02 HISTORY — DX: Gastro-esophageal reflux disease without esophagitis: K21.9

## 2020-02-02 HISTORY — DX: Deficiency of other specified B group vitamins: E53.8

## 2020-02-02 NOTE — Patient Instructions (Addendum)
Your procedure is scheduled on:  Thursday, December 16 Report to the Registration Desk on the 1st floor of the [facility]. To find out your arrival time, please call [phone] between 1PM - 3PM on: Wednesday, December 15  REMEMBER: Instructions that are not followed completely may result in serious medical risk, up to and including death; or upon the discretion of your surgeon and anesthesiologist your surgery may need to be rescheduled.  Do not eat food after midnight the night before surgery.  No gum chewing, lozengers or hard candies.  You may however, drink CLEAR liquids up to 2 hours before you are scheduled to arrive for your surgery. Do not drink anything within 2 hours of your scheduled arrival time.  Clear liquids include: - water  - apple juice without pulp - gatorade (not RED, PURPLE, OR BLUE) - black coffee or tea (Do NOT add milk or creamers to the coffee or tea) Do NOT drink anything that is not on this list.  TAKE THESE MEDICATIONS THE MORNING OF SURGERY WITH A SIP OF WATER:  1.  Metoprolol 2.  Omeprazole - (take one the night before and one on the morning of surgery - helps to prevent nausea after surgery.)  One week prior to surgery: Stop Anti-inflammatories (NSAIDS) such as Advil, Aleve, Ibuprofen, Motrin, Naproxen, Naprosyn and Aspirin based products such as Excedrin, Goodys Powder, BC Powder. Stop ANY OVER THE COUNTER supplements until after surgery. (However, you may continue taking Vitamin B up until the day before surgery.)  No Alcohol for 24 hours before or after surgery.  No Smoking including e-cigarettes for 24 hours prior to surgery.  No chewable tobacco products for at least 6 hours prior to surgery.  No nicotine patches on the day of surgery.  Do not use any "recreational" drugs for at least a week prior to your surgery.  Please be advised that the combination of cocaine and anesthesia may have negative outcomes, up to and including death. If  you test positive for cocaine, your surgery will be cancelled.  On the morning of surgery brush your teeth with toothpaste and water, you may rinse your mouth with mouthwash if you wish. Do not swallow any toothpaste or mouthwash.  Do not wear jewelry, make-up, hairpins, clips or nail polish.  Do not wear lotions, powders, or perfumes.   Do not shave body from the neck down 48 hours prior to surgery just in case you cut yourself which could leave a site for infection.  Also, freshly shaved skin may become irritated if using the CHG soap.  Do not bring valuables to the hospital. [facility] is not responsible for any missing/lost belongings or valuables.   Use CHG Soap as directed on instruction sheet.  Notify your doctor if there is any change in your medical condition (cold, fever, infection).  Wear comfortable clothing (specific to your surgery type) to the hospital.  Plan for stool softeners for home use; pain medications have a tendency to cause constipation. You can also help prevent constipation by eating foods high in fiber such as fruits and vegetables and drinking plenty of fluids as your diet allows.  After surgery, you can help prevent lung complications by doing breathing exercises.  Take deep breaths and cough every 1-2 hours. Your doctor may order a device called an Incentive Spirometer to help you take deep breaths. When coughing or sneezing, hold a pillow firmly against your incision with both hands. This is called "splinting." Doing this helps  protect your incision. It also decreases belly discomfort.  If you are being discharged the day of surgery, you will not be allowed to drive home. You will need a responsible adult (18 years or older) to drive you home and stay with you that night.   If you are taking public transportation, you will need to have a responsible adult (18 years or older) with you. Please confirm with your physician that it is acceptable to use  public transportation.   Please call the [facility] at [phone] if you have any questions about these instructions.  Visitation Policy:  Patients undergoing a surgery or procedure may have one family member or support person with them as long as that person is not COVID-19 positive or experiencing its symptoms.  That person may remain in the waiting area during the procedure.

## 2020-02-03 ENCOUNTER — Encounter
Admission: RE | Admit: 2020-02-03 | Discharge: 2020-02-03 | Disposition: A | Discharge status: Home / Self Care | Payer: BC Managed Care – PPO | Source: Ambulatory Visit | Attending: Obstetrics & Gynecology | Admitting: Obstetrics & Gynecology

## 2020-02-03 DIAGNOSIS — Z01818 Encounter for other preprocedural examination: Secondary | ICD-10-CM | POA: Diagnosis present

## 2020-02-03 DIAGNOSIS — I10 Essential (primary) hypertension: Secondary | ICD-10-CM | POA: Insufficient documentation

## 2020-02-03 LAB — BASIC METABOLIC PANEL
Anion gap: 10 (ref 5–15)
BUN: 18 mg/dL (ref 6–20)
CO2: 23 mmol/L (ref 22–32)
Calcium: 8.8 mg/dL — ABNORMAL LOW (ref 8.9–10.3)
Chloride: 104 mmol/L (ref 98–111)
Creatinine, Ser: 0.73 mg/dL (ref 0.44–1.00)
GFR, Estimated: >60 mL/min (ref >60)
Glucose, Bld: 108 mg/dL — ABNORMAL HIGH (ref 70–99)
Potassium: 4.2 mmol/L (ref 3.5–5.1)
Sodium: 137 mmol/L (ref 135–145)

## 2020-02-03 LAB — CBC
HCT: 38.9 % (ref 36.0–46.0)
Hemoglobin: 13.3 g/dL (ref 12.0–15.0)
MCH: 29.6 pg (ref 26.0–34.0)
MCHC: 34.2 g/dL (ref 30.0–36.0)
MCV: 86.6 fL (ref 80.0–100.0)
Platelets: 203 10*3/uL (ref 150–400)
RBC: 4.49 MIL/uL (ref 3.87–5.11)
RDW: 12.5 % (ref 11.5–15.5)
WBC: 5.3 10*3/uL (ref 4.0–10.5)
nRBC: 0 % (ref 0.0–0.2)

## 2020-02-04 LAB — TYPE AND SCREEN
ABO/RH(D): O NEG
Antibody Screen: NEGATIVE

## 2020-02-09 ENCOUNTER — Other Ambulatory Visit: Payer: Self-pay

## 2020-02-09 ENCOUNTER — Other Ambulatory Visit
Admission: RE | Admit: 2020-02-09 | Discharge: 2020-02-09 | Disposition: A | Discharge status: Home / Self Care | Payer: BC Managed Care – PPO | Source: Ambulatory Visit | Attending: Obstetrics & Gynecology | Admitting: Obstetrics & Gynecology

## 2020-02-09 DIAGNOSIS — N800 Endometriosis of uterus: Secondary | ICD-10-CM | POA: Diagnosis not present

## 2020-02-09 DIAGNOSIS — N943 Premenstrual tension syndrome: Secondary | ICD-10-CM | POA: Diagnosis not present

## 2020-02-09 DIAGNOSIS — Z803 Family history of malignant neoplasm of breast: Secondary | ICD-10-CM | POA: Diagnosis not present

## 2020-02-09 DIAGNOSIS — Z79899 Other long term (current) drug therapy: Secondary | ICD-10-CM | POA: Diagnosis not present

## 2020-02-09 DIAGNOSIS — Z20822 Contact with and (suspected) exposure to covid-19: Secondary | ICD-10-CM | POA: Insufficient documentation

## 2020-02-09 DIAGNOSIS — Z01812 Encounter for preprocedural laboratory examination: Secondary | ICD-10-CM | POA: Insufficient documentation

## 2020-02-09 DIAGNOSIS — D259 Leiomyoma of uterus, unspecified: Secondary | ICD-10-CM | POA: Diagnosis not present

## 2020-02-09 DIAGNOSIS — N920 Excessive and frequent menstruation with regular cycle: Secondary | ICD-10-CM | POA: Diagnosis present

## 2020-02-09 DIAGNOSIS — Z87891 Personal history of nicotine dependence: Secondary | ICD-10-CM | POA: Diagnosis not present

## 2020-02-09 DIAGNOSIS — Z882 Allergy status to sulfonamides status: Secondary | ICD-10-CM | POA: Diagnosis not present

## 2020-02-09 DIAGNOSIS — Z793 Long term (current) use of hormonal contraceptives: Secondary | ICD-10-CM | POA: Diagnosis not present

## 2020-02-09 LAB — SARS CORONAVIRUS 2 (TAT 6-24 HRS): SARS Coronavirus 2: NEGATIVE

## 2020-02-11 ENCOUNTER — Encounter
Admission: RE | Disposition: A | Discharge status: Home / Self Care | Payer: Self-pay | Source: Home / Self Care | Attending: Obstetrics & Gynecology

## 2020-02-11 ENCOUNTER — Ambulatory Visit
Admission: RE | Admit: 2020-02-11 | Discharge: 2020-02-11 | Disposition: A | Discharge status: Home / Self Care | Payer: BC Managed Care – PPO | Attending: Obstetrics & Gynecology | Admitting: Obstetrics & Gynecology

## 2020-02-11 ENCOUNTER — Encounter: Payer: Self-pay | Admitting: Obstetrics & Gynecology

## 2020-02-11 ENCOUNTER — Ambulatory Visit: Payer: BC Managed Care – PPO | Admitting: Anesthesiology

## 2020-02-11 ENCOUNTER — Other Ambulatory Visit: Payer: Self-pay

## 2020-02-11 DIAGNOSIS — Z803 Family history of malignant neoplasm of breast: Secondary | ICD-10-CM | POA: Insufficient documentation

## 2020-02-11 DIAGNOSIS — Z87891 Personal history of nicotine dependence: Secondary | ICD-10-CM | POA: Insufficient documentation

## 2020-02-11 DIAGNOSIS — Z20822 Contact with and (suspected) exposure to covid-19: Secondary | ICD-10-CM | POA: Insufficient documentation

## 2020-02-11 DIAGNOSIS — N800 Endometriosis of uterus: Secondary | ICD-10-CM | POA: Insufficient documentation

## 2020-02-11 DIAGNOSIS — Z793 Long term (current) use of hormonal contraceptives: Secondary | ICD-10-CM | POA: Insufficient documentation

## 2020-02-11 DIAGNOSIS — N920 Excessive and frequent menstruation with regular cycle: Secondary | ICD-10-CM | POA: Insufficient documentation

## 2020-02-11 DIAGNOSIS — D219 Benign neoplasm of connective and other soft tissue, unspecified: Secondary | ICD-10-CM | POA: Diagnosis present

## 2020-02-11 DIAGNOSIS — R102 Pelvic and perineal pain: Secondary | ICD-10-CM | POA: Diagnosis present

## 2020-02-11 DIAGNOSIS — D259 Leiomyoma of uterus, unspecified: Secondary | ICD-10-CM | POA: Insufficient documentation

## 2020-02-11 DIAGNOSIS — Z79899 Other long term (current) drug therapy: Secondary | ICD-10-CM | POA: Insufficient documentation

## 2020-02-11 DIAGNOSIS — Z882 Allergy status to sulfonamides status: Secondary | ICD-10-CM | POA: Insufficient documentation

## 2020-02-11 DIAGNOSIS — N943 Premenstrual tension syndrome: Secondary | ICD-10-CM | POA: Insufficient documentation

## 2020-02-11 HISTORY — PX: CYSTOSCOPY: SHX5120

## 2020-02-11 HISTORY — PX: TOTAL LAPAROSCOPIC HYSTERECTOMY WITH SALPINGECTOMY: SHX6742

## 2020-02-11 LAB — ABO/RH: ABO/RH(D): O NEG

## 2020-02-11 LAB — POCT PREGNANCY, URINE: Preg Test, Ur: NEGATIVE

## 2020-02-11 SURGERY — HYSTERECTOMY, TOTAL, LAPAROSCOPIC, WITH SALPINGECTOMY
Anesthesia: General

## 2020-02-11 MED ORDER — ONDANSETRON HCL 4 MG/2ML IJ SOLN
INTRAMUSCULAR | Status: AC
  Filled 2020-02-11: qty 2

## 2020-02-11 MED ORDER — FENTANYL CITRATE (PF) 100 MCG/2ML IJ SOLN
25.0000 ug | INTRAMUSCULAR | Status: DC | PRN
  Administered 2020-02-11 (×2): 50 ug via INTRAVENOUS

## 2020-02-11 MED ORDER — DEXAMETHASONE SODIUM PHOSPHATE 10 MG/ML IJ SOLN
INTRAMUSCULAR | Status: DC | PRN
  Administered 2020-02-11: 10 mg via INTRAVENOUS

## 2020-02-11 MED ORDER — BUPIVACAINE LIPOSOME 1.3 % IJ SUSP
INTRAMUSCULAR | Status: AC
  Filled 2020-02-11: qty 60

## 2020-02-11 MED ORDER — FENTANYL CITRATE (PF) 100 MCG/2ML IJ SOLN
INTRAMUSCULAR | Status: AC
  Filled 2020-02-11: qty 2

## 2020-02-11 MED ORDER — ROCURONIUM BROMIDE 10 MG/ML (PF) SYRINGE
PREFILLED_SYRINGE | INTRAVENOUS | Status: AC
  Filled 2020-02-11: qty 10

## 2020-02-11 MED ORDER — LACTATED RINGERS IV SOLN: INTRAVENOUS | Status: DC

## 2020-02-11 MED ORDER — ACETAMINOPHEN 650 MG RE SUPP
650.0000 mg | RECTAL | Status: DC | PRN
  Filled 2020-02-11: qty 1

## 2020-02-11 MED ORDER — LIDOCAINE HCL (PF) 2 % IJ SOLN
INTRAMUSCULAR | Status: AC
  Filled 2020-02-11: qty 5

## 2020-02-11 MED ORDER — KETOROLAC TROMETHAMINE 30 MG/ML IJ SOLN
INTRAMUSCULAR | Status: DC | PRN
  Administered 2020-02-11: 30 mg via INTRAVENOUS

## 2020-02-11 MED ORDER — ONDANSETRON HCL 4 MG/2ML IJ SOLN
INTRAMUSCULAR | Status: DC | PRN
  Administered 2020-02-11: 4 mg via INTRAVENOUS

## 2020-02-11 MED ORDER — HYDROMORPHONE HCL 1 MG/ML IJ SOLN
INTRAMUSCULAR | Status: AC
  Filled 2020-02-11: qty 1

## 2020-02-11 MED ORDER — HYDROMORPHONE HCL 1 MG/ML IJ SOLN
INTRAMUSCULAR | Status: DC | PRN
  Administered 2020-02-11 (×2): .2 mg via INTRAVENOUS
  Administered 2020-02-11: .4 mg via INTRAVENOUS

## 2020-02-11 MED ORDER — OXYCODONE-ACETAMINOPHEN 5-325 MG PO TABS: 1.0000 | ORAL_TABLET | ORAL | 0 refills | Status: DC | PRN

## 2020-02-11 MED ORDER — ACETAMINOPHEN 10 MG/ML IV SOLN
INTRAVENOUS | Status: DC | PRN
  Administered 2020-02-11: 1000 mg via INTRAVENOUS

## 2020-02-11 MED ORDER — PROMETHAZINE HCL 25 MG/ML IJ SOLN
INTRAMUSCULAR | Status: AC
  Administered 2020-02-11: 11:00:00 6.25 mg via INTRAVENOUS
  Filled 2020-02-11: qty 1

## 2020-02-11 MED ORDER — PROPOFOL 10 MG/ML IV BOLUS
INTRAVENOUS | Status: AC
  Filled 2020-02-11: qty 20

## 2020-02-11 MED ORDER — ACETAMINOPHEN 325 MG PO TABS: 650.0000 mg | ORAL_TABLET | ORAL | Status: DC | PRN

## 2020-02-11 MED ORDER — ACETAMINOPHEN 325 MG PO TABS: 325.0000 mg | ORAL_TABLET | ORAL | Status: DC | PRN

## 2020-02-11 MED ORDER — SODIUM CHLORIDE 0.9 % IV SOLN
2.0000 g | INTRAVENOUS | Status: AC
  Administered 2020-02-11: 08:00:00 2 g via INTRAVENOUS

## 2020-02-11 MED ORDER — SUGAMMADEX SODIUM 200 MG/2ML IV SOLN
INTRAVENOUS | Status: DC | PRN
  Administered 2020-02-11: 200 mg via INTRAVENOUS

## 2020-02-11 MED ORDER — MORPHINE SULFATE (PF) 2 MG/ML IV SOLN: 1.0000 mg | INTRAVENOUS | Status: DC | PRN

## 2020-02-11 MED ORDER — DROPERIDOL 2.5 MG/ML IJ SOLN
0.6250 mg | Freq: Once | INTRAMUSCULAR | Status: DC | PRN
  Filled 2020-02-11: qty 2

## 2020-02-11 MED ORDER — OXYCODONE-ACETAMINOPHEN 5-325 MG PO TABS: 1.0000 | ORAL_TABLET | ORAL | Status: DC | PRN

## 2020-02-11 MED ORDER — LIDOCAINE HCL (CARDIAC) PF 100 MG/5ML IV SOSY
PREFILLED_SYRINGE | INTRAVENOUS | Status: DC | PRN
  Administered 2020-02-11: 100 mg via INTRAVENOUS

## 2020-02-11 MED ORDER — SUCCINYLCHOLINE CHLORIDE 20 MG/ML IJ SOLN
INTRAMUSCULAR | Status: DC | PRN
  Administered 2020-02-11: 120 mg via INTRAVENOUS

## 2020-02-11 MED ORDER — SODIUM CHLORIDE 0.9 % IV SOLN
INTRAVENOUS | Status: AC
  Filled 2020-02-11: qty 2

## 2020-02-11 MED ORDER — HYDROCODONE-ACETAMINOPHEN 7.5-325 MG PO TABS: 1.0000 | ORAL_TABLET | Freq: Once | ORAL | Status: DC | PRN

## 2020-02-11 MED ORDER — ACETAMINOPHEN 10 MG/ML IV SOLN
INTRAVENOUS | Status: AC
  Filled 2020-02-11: qty 100

## 2020-02-11 MED ORDER — DEXAMETHASONE SODIUM PHOSPHATE 10 MG/ML IJ SOLN
INTRAMUSCULAR | Status: AC
  Filled 2020-02-11: qty 1

## 2020-02-11 MED ORDER — KETOROLAC TROMETHAMINE 30 MG/ML IJ SOLN: 30.0000 mg | Freq: Once | INTRAMUSCULAR | Status: DC | PRN

## 2020-02-11 MED ORDER — CHLORHEXIDINE GLUCONATE 0.12 % MT SOLN
OROMUCOSAL | Status: AC
  Administered 2020-02-11: 06:00:00 15 mL via OROMUCOSAL
  Filled 2020-02-11: qty 15

## 2020-02-11 MED ORDER — HEMOSTATIC AGENTS (NO CHARGE) OPTIME
TOPICAL | Status: DC | PRN
  Administered 2020-02-11: 1 via TOPICAL

## 2020-02-11 MED ORDER — FENTANYL CITRATE (PF) 100 MCG/2ML IJ SOLN
INTRAMUSCULAR | Status: DC | PRN
  Administered 2020-02-11 (×2): 50 ug via INTRAVENOUS

## 2020-02-11 MED ORDER — ACETAMINOPHEN 160 MG/5ML PO SOLN
325.0000 mg | ORAL | Status: DC | PRN
  Filled 2020-02-11: qty 20.3

## 2020-02-11 MED ORDER — PROMETHAZINE HCL 25 MG/ML IJ SOLN: 6.2500 mg | INTRAMUSCULAR | Status: DC | PRN

## 2020-02-11 MED ORDER — BUPIVACAINE HCL (PF) 0.5 % IJ SOLN
INTRAMUSCULAR | Status: DC | PRN
  Administered 2020-02-11: 13 mL

## 2020-02-11 MED ORDER — POVIDONE-IODINE 10 % EX SWAB
2.0000 "application " | Freq: Once | CUTANEOUS | Status: AC
  Administered 2020-02-11: 2 via TOPICAL

## 2020-02-11 MED ORDER — ORAL CARE MOUTH RINSE: 15.0000 mL | Freq: Once | OROMUCOSAL | Status: AC

## 2020-02-11 MED ORDER — CHLORHEXIDINE GLUCONATE 0.12 % MT SOLN: 15.0000 mL | Freq: Once | OROMUCOSAL | Status: AC

## 2020-02-11 MED ORDER — MIDAZOLAM HCL 2 MG/2ML IJ SOLN
INTRAMUSCULAR | Status: AC
  Filled 2020-02-11: qty 2

## 2020-02-11 MED ORDER — ROCURONIUM BROMIDE 100 MG/10ML IV SOLN
INTRAVENOUS | Status: DC | PRN
  Administered 2020-02-11: 20 mg via INTRAVENOUS
  Administered 2020-02-11: 10 mg via INTRAVENOUS
  Administered 2020-02-11: 20 mg via INTRAVENOUS
  Administered 2020-02-11: 30 mg via INTRAVENOUS

## 2020-02-11 MED ORDER — MIDAZOLAM HCL 2 MG/2ML IJ SOLN
INTRAMUSCULAR | Status: DC | PRN
  Administered 2020-02-11: 2 mg via INTRAVENOUS

## 2020-02-11 MED ORDER — PROPOFOL 10 MG/ML IV BOLUS
INTRAVENOUS | Status: DC | PRN
  Administered 2020-02-11: 20 mg via INTRAVENOUS
  Administered 2020-02-11: 200 mg via INTRAVENOUS

## 2020-02-11 SURGICAL SUPPLY — 58 items
ADH SKN CLS APL DERMABOND .7 (GAUZE/BANDAGES/DRESSINGS) ×2
APL PRP STRL LF DISP 70% ISPRP (MISCELLANEOUS) ×2
APL SRG 38 LTWT LNG FL B (MISCELLANEOUS) ×2
APPLICATOR ARISTA FLEXITIP XL (MISCELLANEOUS) ×2 IMPLANT
BAG DRN RND TRDRP ANRFLXCHMBR (UROLOGICAL SUPPLIES) ×2
BAG URINE DRAIN 2000ML AR STRL (UROLOGICAL SUPPLIES) ×4 IMPLANT
BLADE SURG SZ11 CARB STEEL (BLADE) ×4 IMPLANT
CATH FOLEY 2WAY  5CC 16FR (CATHETERS) ×2
CATH FOLEY 2WAY 5CC 16FR (CATHETERS) ×2
CATH URTH 16FR FL 2W BLN LF (CATHETERS) ×2 IMPLANT
CHLORAPREP W/TINT 26 (MISCELLANEOUS) ×4 IMPLANT
COVER WAND RF STERILE (DRAPES) ×4 IMPLANT
DEFOGGER SCOPE WARMER CLEARIFY (MISCELLANEOUS) ×4 IMPLANT
DERMABOND ADVANCED (GAUZE/BANDAGES/DRESSINGS) ×2
DERMABOND ADVANCED .7 DNX12 (GAUZE/BANDAGES/DRESSINGS) ×2 IMPLANT
DEVICE SUTURE ENDOST 10MM (ENDOMECHANICALS) ×2 IMPLANT
DRAPE CAMERA CLOSED 9X96 (DRAPES) ×4 IMPLANT
DRSG TEGADERM 2-3/8X2-3/4 SM (GAUZE/BANDAGES/DRESSINGS) ×6 IMPLANT
GAUZE 4X4 16PLY RFD (DISPOSABLE) ×4 IMPLANT
GLOVE BIO SURGEON STRL SZ8 (GLOVE) ×24 IMPLANT
GLOVE INDICATOR 8.0 STRL GRN (GLOVE) ×16 IMPLANT
GOWN STRL REUS W/ TWL LRG LVL3 (GOWN DISPOSABLE) ×8 IMPLANT
GOWN STRL REUS W/ TWL XL LVL3 (GOWN DISPOSABLE) ×4 IMPLANT
GOWN STRL REUS W/TWL LRG LVL3 (GOWN DISPOSABLE) ×16
GOWN STRL REUS W/TWL XL LVL3 (GOWN DISPOSABLE) ×8
GRASPER SUT TROCAR 14GX15 (MISCELLANEOUS) ×4 IMPLANT
HEMOSTAT ARISTA ABSORB 3G PWDR (HEMOSTASIS) ×2 IMPLANT
IRRIGATION STRYKERFLOW (MISCELLANEOUS) ×2 IMPLANT
IRRIGATOR STRYKERFLOW (MISCELLANEOUS) ×4
IV LACTATED RINGERS 1000ML (IV SOLUTION) ×8 IMPLANT
KIT PINK PAD W/HEAD ARE REST (MISCELLANEOUS) ×4
KIT PINK PAD W/HEAD ARM REST (MISCELLANEOUS) ×2 IMPLANT
KIT TURNOVER CYSTO (KITS) ×4 IMPLANT
LABEL OR SOLS (LABEL) ×4 IMPLANT
MANIFOLD NEPTUNE II (INSTRUMENTS) ×4 IMPLANT
MANIPULATOR VCARE STD CRV RETR (MISCELLANEOUS) ×2 IMPLANT
NEEDLE VERESS 14GA 120MM (NEEDLE) ×4 IMPLANT
NS IRRIG 500ML POUR BTL (IV SOLUTION) ×4 IMPLANT
OCCLUDER COLPOPNEUMO (BALLOONS) ×4 IMPLANT
PACK GYN LAPAROSCOPIC (MISCELLANEOUS) ×4 IMPLANT
PAD OB MATERNITY 4.3X12.25 (PERSONAL CARE ITEMS) ×4 IMPLANT
PAD PREP 24X41 OB/GYN DISP (PERSONAL CARE ITEMS) ×4 IMPLANT
PORT ACCESS TROCAR AIRSEAL 12 (TROCAR) ×2 IMPLANT
PORT ACCESS TROCAR AIRSEAL 5M (TROCAR) ×2
SCISSORS METZENBAUM CVD 33 (INSTRUMENTS) ×2 IMPLANT
SET CYSTO W/LG BORE CLAMP LF (SET/KITS/TRAYS/PACK) ×4 IMPLANT
SET TRI-LUMEN FLTR TB AIRSEAL (TUBING) ×4 IMPLANT
SHEARS HARMONIC ACE PLUS 36CM (ENDOMECHANICALS) ×4 IMPLANT
SLEEVE ENDOPATH XCEL 5M (ENDOMECHANICALS) ×4 IMPLANT
SPONGE GAUZE 2X2 8PLY STER LF (GAUZE/BANDAGES/DRESSINGS) ×1
SPONGE GAUZE 2X2 8PLY STRL LF (GAUZE/BANDAGES/DRESSINGS) ×1 IMPLANT
SUT ENDO VLOC 180-0-8IN (SUTURE) ×2 IMPLANT
SUT VIC AB 0 CT1 36 (SUTURE) ×4 IMPLANT
SUT VIC AB 4-0 FS2 27 (SUTURE) ×4 IMPLANT
SYR 10ML LL (SYRINGE) ×4 IMPLANT
SYR 50ML LL SCALE MARK (SYRINGE) ×4 IMPLANT
TROCAR PORT AIRSEAL 8X100 (TROCAR) ×4 IMPLANT
TROCAR XCEL NON-BLD 5MMX100MML (ENDOMECHANICALS) ×4 IMPLANT

## 2020-02-11 NOTE — Transfer of Care (Signed)
Immediate Anesthesia Transfer of Care Note  Patient: Rebecca Phelps  Procedure(s) Performed: TOTAL LAPAROSCOPIC HYSTERECTOMY WITH SALPINGECTOMY (Bilateral ) CYSTOSCOPY (N/A )  Patient Location: PACU  Anesthesia Type:General  Level of Consciousness: awake and drowsy  Airway & Oxygen Therapy: Patient Spontanous Breathing and Patient connected to face mask oxygen  Post-op Assessment: Report given to RN and Post -op Vital signs reviewed and stable  Post vital signs: stable  Last Vitals:  Vitals Value Taken Time  BP 141/94 02/11/20 0952  Temp    Pulse 93 02/11/20 0954  Resp 19 02/11/20 0954  SpO2 100 % 02/11/20 0954  Vitals shown include unvalidated device data.  Last Pain:  Vitals:   02/11/20 0619  TempSrc: Tympanic  PainSc: 0-No pain         Complications: No complications documented.

## 2020-02-11 NOTE — Op Note (Signed)
Operative Report:  PRE-OP DIAGNOSIS: Menorrhagia with regular cycle N92.0  Endometriosis N80.9  PMS premenstrual syndrome N94.3   POST-OP DIAGNOSIS: Menorrhagia with regular cycle N92.0  Endometriosis N80.9  PMS premenstrual syndrome N94.3   PROCEDURE: Procedure(s): TOTAL LAPAROSCOPIC HYSTERECTOMY WITH SALPINGECTOMY CYSTOSCOPY  SURGEON: Barnett Applebaum, MD, FACOG  ASSISTANT: Dr Gilman Schmidt, No other capable assistant available, in surgery requiring high level assistant.   ANESTHESIA: General endotracheal anesthesia  ESTIMATED BLOOD LOSS: less than 50   SPECIMENS: Uterus, Tubes.  COMPLICATIONS: None  DISPOSITION: stable to PACU  FINDINGS: Intraabdominal adhesions were not noted. Fibroids anterior uterus, small Normal ovaries  PROCEDURE:   The patient was taken to the OR where anesthesia was administed. She was prepped and draped in the normal sterile fashion in the dorsal lithotomy position in the [place] stirrups. A time out was performed. A Graves speculum was inserted, the cervix was grasped with a single tooth tenaculum and the endometrial cavity was sounded. The cervix was progressively dilated to a size 18 [place] with [employer] dilators. A V-Care uterine manipulator was inserted in the usual fashion without incident. Gloves were changed and attention was turned to the abdomen.    An infraumbilical transverse 75mm skin incision was made with the scalpel after local anesthesia applied to the skin. A Veress-step needle was inserted in the usual fashion and confirmed using the hanging drop technique. A pneumoperitoneum was obtained by insufflation of CO2 (opening pressure of 1mmHg) to 105mmHg. An 8 mm trocar is then placed under direct visualization with the laparoscope. A diagnostic laparoscopy was performed yielding the previously described findings. Attention was turned to the left lower quadrant where after visualization of the inferior epigastric vessels a 87mm skin incision was made with  the scalpel. A 5 mm laparoscopic port was inserted. The same procedure was repeated in the right lower quadrant with a 3mm trocar. Attention was turned to the left aspect of the uterus, where after visualization of the ureter, the round ligament was coagulated and transected using the 58mm Harmonic Scapel. The anterior and posterior leafs of the broad ligament were dissected off as the anterior one was coagulated and transected in a caudal direction towards the cuff of the uterine manipulator.  Attention was then turned to the left fallopian tube which was recognized by visualization of the fimbria. The tube is excised to its attachment to the uterus. The uterine-ovarian ligament and its blood vessels were carefully coagulated and transected using the Harmonic scapel.  Attention was turned to the right aspect of the uterus where the same procedure was performed.  The vesicouterine reflection of the peritoneum was dissected with the harmonic scapel and the bladder flap was created bluntly.  The uterine vessels were coagulated and transected bilaterally using first bipolar cautery and then the harmonic scapel. A 360 degree, circumferential colpotomy was done to completely amputate the uterus with cervix and tubes. Once the specimen was amputated it was delivered through the vagina.    The colpotomy was repaired in a simple running fashion using a delayed absorbable suture with an endo-stitch device.  Vaginal exam confirms complete closure.  The cavity was copiously irrigated. A survey of the pelvic cavity revealed adequate hemostasis and no injury to bowel, bladder, or ureter.  The assistance of my assisting-physician was vital to resect and retract interchangably with self on each side.    A diagnostic cystoscopy was performed using saline distension of bladder with no lesions or injuries noted.  Bilateral urine flow from each ureteral orifice  is visualized.   At this point the procedure was finalized. The  umbilical fascia incision is closed with a vicryl suture using the fascia closure device. All the instruments were removed from the patient's body. Gas was expelled and patient is leveled.  Incisions are closed with skin adhesive.     Patient goes to recovery room in stable condition.  All sponge, instrument, and needle counts are correct x2.     Barnett Applebaum, MD, [facility], [facility] 02/11/2020  9:40 AM

## 2020-02-11 NOTE — Anesthesia Preprocedure Evaluation (Addendum)
Anesthesia Evaluation  Patient identified by MRN, date of birth, ID band Patient awake    Reviewed: Allergy & Precautions, H&P , NPO status , reviewed documented beta blocker date and time   Airway Mallampati: II  TM Distance: >3 FB Neck ROM: full    Dental  (+) Caps, Dental Advidsory Given   Pulmonary former smoker,    Pulmonary exam normal        Cardiovascular hypertension, Normal cardiovascular exam     Neuro/Psych PSYCHIATRIC DISORDERS Anxiety Depression    GI/Hepatic GERD  Medicated and Controlled,  Endo/Other    Renal/GU      Musculoskeletal   Abdominal   Peds  Hematology   Anesthesia Other Findings Past Medical History: No date: Anxiety No date: B12 deficiency No date: Depression No date: GERD (gastroesophageal reflux disease) No date: Hyperlipidemia No date: Hypertension 2019: Pre-diabetes Past Surgical History: 1996, 2001: [place]: ECTOPIC PREGNANCY SURGERY 2013: ENDOMETRIAL ABLATION 2007: LAPAROSCOPIC ABDOMINAL EXPLORATION 2001: TUBAL LIGATION   Reproductive/Obstetrics                            Anesthesia Physical Anesthesia Plan  ASA: II  Anesthesia Plan: General   Post-op Pain Management:    Induction: Intravenous  PONV Risk Score and Plan: 4 or greater and Ondansetron, Treatment may vary due to age or medical condition, Midazolam, Dexamethasone and Diphenhydramine  Airway Management Planned: Oral ETT  Additional Equipment:   Intra-op Plan:   Post-operative Plan: Extubation in OR  Informed Consent: I have reviewed the patients History and Physical, chart, labs and discussed the procedure including the risks, benefits and alternatives for the proposed anesthesia with the patient or authorized representative who has indicated his/her understanding and acceptance.     Dental Advisory Given  Plan Discussed with: CRNA  Anesthesia Plan  Comments:        Anesthesia Quick Evaluation

## 2020-02-11 NOTE — Interval H&P Note (Signed)
History and Physical Interval Note:  02/11/2020 7:07 AM  Rebecca Phelps  has presented today for surgery, with the diagnosis of Menorrhagia with regular cycle N92.0  Endometriosis N80.9  PMS premenstrual syndrome N94.3.  The various methods of treatment have been discussed with the patient and family. After consideration of risks, benefits and other options for treatment, the patient has consented to  Procedure(s): TOTAL LAPAROSCOPIC HYSTERECTOMY WITH SALPINGECTOMY (Bilateral) as a surgical intervention.  The patient's history has been reviewed, patient examined, no change in status, stable for surgery.  I have reviewed the patient's chart and labs.  Questions were answered to the patient's satisfaction.     Hoyt Koch

## 2020-02-11 NOTE — Anesthesia Postprocedure Evaluation (Signed)
Anesthesia Post Note  Patient: Scientist, physiological  Procedure(s) Performed: TOTAL LAPAROSCOPIC HYSTERECTOMY WITH SALPINGECTOMY (Bilateral ) CYSTOSCOPY (N/A )  Patient location during evaluation: PACU Anesthesia Type: General Level of consciousness: awake and alert Pain management: pain level controlled Vital Signs Assessment: post-procedure vital signs reviewed and stable Respiratory status: spontaneous breathing, nonlabored ventilation and respiratory function stable Cardiovascular status: blood pressure returned to baseline and stable Postop Assessment: no apparent nausea or vomiting Anesthetic complications: no   No complications documented.   Last Vitals:  Vitals:   02/11/20 1106 02/11/20 1229  BP: (!) 147/84 134/76  Pulse: 89 81  Resp: 18   Temp: (!) 35.9 C   SpO2: 95% 100%    Last Pain:  Vitals:   02/11/20 1229  TempSrc:   PainSc: 0-No pain                 Alphonsus Sias

## 2020-02-11 NOTE — Discharge Instructions (Signed)
Total Laparoscopic Hysterectomy, Care After This sheet gives you information about how to care for yourself after your procedure. Your health care provider may also give you more specific instructions. If you have problems or questions, contact your health care provider. What can I expect after the procedure? After the procedure, it is common to have:  Pain and bruising around your incisions.  A sore throat, if a breathing tube was used during surgery.  Fatigue.  Poor appetite.  Less interest in sex. If your ovaries were also removed, it is also common to have symptoms of menopause such as hot flashes, night sweats, and lack of sleep (insomnia). Follow these instructions at home: Bathing  Do not take baths, swim, or use a hot tub until your health care provider approves. You may need to only take showers for 2-3 weeks.  Keep your bandage (dressing) dry until your health care provider says it can be removed. Incision care   Follow instructions from your health care provider about how to take care of your incisions. Make sure you: ? Wash your hands with soap and water before you change your dressing. If soap and water are not available, use hand sanitizer. ? Change your dressing as told by your health care provider. ? Leave stitches (sutures), skin glue, or adhesive strips in place. These skin closures may need to stay in place for 2 weeks or longer. If adhesive strip edges start to loosen and curl up, you may trim the loose edges. Do not remove adhesive strips completely unless your health care provider tells you to do that.  Check your incision area every day for signs of infection. Check for: ? Redness, swelling, or pain. ? Fluid or blood. ? Warmth. ? Pus or a bad smell. Activity  Get plenty of rest and sleep.  Do not lift anything that is heavier than 10 lbs (4.5 kg) for one month after surgery, or as long as told by your health care provider.  Do not drive or use heavy  machinery while taking prescription pain medicine.  Do not drive for 24 hours if you were given a medicine to help you relax (sedative).  Return to your normal activities as told by your health care provider. Ask your health care provider what activities are safe for you. Lifestyle   Do not use any products that contain nicotine or tobacco, such as cigarettes and e-cigarettes. These can delay healing. If you need help quitting, ask your health care provider.  Do not drink alcohol until your health care provider approves. General instructions  Do not douche, use tampons, or have sex for at least 6 weeks, or as told by your health care provider.  Take over-the-counter and prescription medicines only as told by your health care provider.  To monitor yourself for a fever, take your temperature at least once a day during recovery.  If you struggle with physical or emotional changes after your procedure, speak with your health care provider or a therapist.  To prevent or treat constipation while you are taking prescription pain medicine, your health care provider may recommend that you: ? Drink enough fluid to keep your urine clear or pale yellow. ? Take over-the-counter or prescription medicines. ? Eat foods that are high in fiber, such as fresh fruits and vegetables, whole grains, and beans. ? Limit foods that are high in fat and processed sugars, such as fried and sweet foods.  Keep all follow-up visits as told by your health care provider.   This is important. Contact a health care provider if:  You have chills or a fever.  You have redness, swelling, or pain around an incision.  You have fluid or blood coming from an incision.  Your incision feels warm to the touch.  You have pus or a bad smell coming from an incision.  An incision breaks open.  You feel dizzy or light-headed.  You have pain or bleeding when you urinate.  You have diarrhea, nausea, or vomiting that does not  go away.  You have abnormal vaginal discharge.  You have a rash.  You have pain that does not get better with medicine. Get help right away if:  You have a fever and your symptoms suddenly get worse.  You have severe abdominal pain.  You have chest pain.  You have shortness of breath.  You faint.  You have pain, swelling, or redness on your leg.  You have heavy vaginal bleeding with blood clots. Summary  After the procedure it is common to have abdominal pain. Your provider will give you medication for this.  Do not take baths, swim, or use a hot tub until your health care provider approves.  Do not lift anything that is heavier than 10 lbs (4.5 kg) for one month after surgery, or as long as told by your health care provider.  Notify your provider if you have any signs or symptoms of infection after the procedure. This information is not intended to replace advice given to you by your health care provider. Make sure you discuss any questions you have with your health care provider. Document Revised: 01/25/2017 Document Reviewed: 04/25/2016 Elsevier Patient Education  2020 Elsevier Inc[employer]    AMBULATORY SURGERY  DISCHARGE INSTRUCTIONS   1) The drugs that you were given will stay in your system until tomorrow so for the next 24 hours you should not:  A) Drive an automobile B) Make any legal decisions C) Drink any alcoholic beverage   2) You may resume regular meals tomorrow.  Today it is better to start with liquids and gradually work up to solid foods.  You may eat anything you prefer, but it is better to start with liquids, then soup and crackers, and gradually work up to solid foods.   3) Please notify your doctor immediately if you have any unusual bleeding, trouble breathing, redness and pain at the surgery site, drainage, fever, or pain not relieved by medication.    4) Additional Instructions:        Please contact your physician with any problems  or Same Day Surgery at [phone], Monday through Friday 6 am to 4 pm, or [facility] number at [phone]. 

## 2020-02-11 NOTE — Progress Notes (Signed)
   02/11/20 0751  Clinical Encounter Type  Visited With Family  Visit Type Initial  Referral From Chaplain  Consult/Referral To Chaplain  While rounding [facility] waiting area, chaplain briefly visited with Pt's husband, Marden Noble. Marden Noble was fine and did not have any questions or concerns. Chaplain wished him well and a pleasant holiday.

## 2020-02-11 NOTE — Anesthesia Procedure Notes (Signed)
Procedure Name: Intubation Date/Time: 02/11/2020 7:39 AM Performed by: Natasha Mead, CRNA Pre-anesthesia Checklist: Patient identified, Emergency Drugs available, Suction available and Patient being monitored Patient Re-evaluated:Patient Re-evaluated prior to induction Oxygen Delivery Method: Circle system utilized Preoxygenation: Pre-oxygenation with 100% oxygen Induction Type: IV induction Ventilation: Mask ventilation without difficulty Laryngoscope Size: Miller and 2 Grade View: Grade I Tube type: Oral Tube size: 7.0 mm Number of attempts: 1 Airway Equipment and Method: Stylet and Oral airway Placement Confirmation: ETT inserted through vocal cords under direct vision,  positive ETCO2 and breath sounds checked- equal and bilateral Secured at: 20 cm Tube secured with: Tape Dental Injury: Teeth and Oropharynx as per pre-operative assessment

## 2020-02-15 LAB — SURGICAL PATHOLOGY

## 2020-02-24 ENCOUNTER — Encounter: Payer: Self-pay | Admitting: Obstetrics & Gynecology

## 2020-02-24 ENCOUNTER — Other Ambulatory Visit: Payer: Self-pay

## 2020-02-24 ENCOUNTER — Ambulatory Visit (INDEPENDENT_AMBULATORY_CARE_PROVIDER_SITE_OTHER): Payer: BC Managed Care – PPO | Admitting: Obstetrics & Gynecology

## 2020-02-24 VITALS — BP 120/80 | Ht 66.0 in | Wt 235.0 lb

## 2020-02-24 DIAGNOSIS — Z90710 Acquired absence of both cervix and uterus: Secondary | ICD-10-CM | POA: Insufficient documentation

## 2020-02-24 NOTE — Progress Notes (Signed)
  Postoperative Follow-up Patient presents post op from [facility] for Menorrhagia with regular cycle, Endometriosis, and PMS , 2 weeks ago. Path: DIAGNOSIS:  A. UTERUS WITH CERVIX AND BILATERAL FALLOPIAN TUBES; TOTAL HYSTERECTOMY  WITH BILATERAL SALPINGECTOMY:  - UTERINE CERVIX:    - BENIGN TRANSFORMATION ZONE WITH MODERATE INFLAMMATION AND  REACTIVE CHANGES.    - MICROGLANDULAR HYPERPLASIA.    - NEGATIVE FOR SQUAMOUS INTRAEPITHELIAL LESION AND MALIGNANCY.    - ECTOCERVICAL MARGIN FREE OF DYSPLASIA.  - ENDOMETRIUM:    - INACTIVE ENDOMETRIUM.    - NEGATIVE FOR ATYPICAL HYPERPLASIA/EIN AND MALIGNANCY.  - MYOMETRIUM:    - ADENOMYOSIS.    - LEIOMYOMATA UTERI.    - NEGATIVE FOR FEATURES OF MALIGNANCY.  - FALLOPIAN TUBES:    - NO SIGNIFICANT HISTOPATHOLOGIC CHANGE.  Subjective: Patient reports marked improvement in her preop symptoms. Eating a regular diet without difficulty. Pain is controlled with current analgesics. Medications being used: acetaminophen and ibuprofen (OTC).  Activity: normal activities of daily living. Patient reports additional symptom's since surgery of None.  Objective: BP 120/80   Ht 5\' 6"  (1.676 m)   Wt 235 lb (106.6 kg)   LMP  (LMP Unknown)   BMI 37.93 kg/m  Physical Exam Constitutional:      General: She is not in acute distress.    Appearance: She is well-developed and well-nourished.  Cardiovascular:     Rate and Rhythm: Normal rate.  Pulmonary:     Effort: Pulmonary effort is normal.  Abdominal:     General: There is no distension.     Palpations: Abdomen is soft.     Tenderness: There is no abdominal tenderness.     Comments: Incision Healing Well   Musculoskeletal:        General: Normal range of motion.  Neurological:     Mental Status: She is alert and oriented to person, place, and time.     Cranial Nerves: No cranial nerve deficit.  Skin:    General: Skin is warm and dry.  Psychiatric:        Mood and  Affect: Mood and affect normal.     Assessment: s/p :  total laparoscopic hysterectomy with bilateral salpingectomy progressing well  Plan: Patient has done well after surgery with no apparent complications.  I have discussed the post-operative course to date, and the expected progress moving forward.  The patient understands what complications to be concerned about.  I will see the patient in routine follow up, or sooner if needed.    Activity plan: No restriction.  Pelvic rest.  02/24/2020, 4:02 PM

## 2020-03-24 ENCOUNTER — Ambulatory Visit (INDEPENDENT_AMBULATORY_CARE_PROVIDER_SITE_OTHER): Payer: BC Managed Care – PPO | Admitting: Obstetrics & Gynecology

## 2020-03-24 ENCOUNTER — Other Ambulatory Visit: Payer: Self-pay

## 2020-03-24 ENCOUNTER — Encounter: Payer: Self-pay | Admitting: Obstetrics & Gynecology

## 2020-03-24 VITALS — BP 122/82 | Ht 66.0 in | Wt 238.0 lb

## 2020-03-24 DIAGNOSIS — Z90710 Acquired absence of both cervix and uterus: Secondary | ICD-10-CM

## 2020-03-24 NOTE — Progress Notes (Signed)
  Postoperative Follow-up Patient presents post op from [facility] for bleeding and endometriosis, 6 weeks ago.  Subjective: Patient reports marked improvement in her preop symptoms. Eating a regular diet without difficulty. The patient is not having any pain.  Activity: normal activities of daily living. Patient reports additional symptom's since surgery of None.  Objective: BP 122/82   Ht 5\' 6"  (1.676 m)   Wt 238 lb (108 kg)   BMI 38.41 kg/m  Physical Exam Constitutional:      General: She is not in acute distress.    Appearance: She is well-developed and well-nourished.  Genitourinary:     Vagina and rectum normal.     There is no rash, tenderness or lesion on the right labia.     There is no rash, tenderness or lesion on the left labia.    Genitourinary Comments: Cervix and uterus absent. Vaginal cuff healing well.     No vaginal erythema or bleeding.     Vaginal exam comments: Stitch seen but well healed and approximated edges.      Right Adnexa: not tender and no mass present.    Left Adnexa: not tender and no mass present.    Pelvic exam was performed with patient supine.  Cardiovascular:     Rate and Rhythm: Normal rate.  Pulmonary:     Effort: Pulmonary effort is normal.  Abdominal:     General: There is no distension.     Palpations: Abdomen is soft.     Tenderness: There is no abdominal tenderness.     Comments: Incision healing well.  Musculoskeletal:        General: Normal range of motion.  Neurological:     Mental Status: She is alert and oriented to person, place, and time.     Cranial Nerves: No cranial nerve deficit.  Skin:    General: Skin is warm and dry.  Psychiatric:        Mood and Affect: Mood and affect normal.     Assessment: s/p :  total laparoscopic hysterectomy with bilateral salpingectomy progressing well  Plan: Patient has done well after surgery with no apparent complications.  I have discussed the post-operative course to date, and the  expected progress moving forward.  The patient understands what complications to be concerned about.  I will see the patient in routine follow up, or sooner if needed.    Activity plan: No restriction.  Hoyt Koch 03/24/2020, 4:49 PM

## 2020-04-07 ENCOUNTER — Ambulatory Visit (INDEPENDENT_AMBULATORY_CARE_PROVIDER_SITE_OTHER): Payer: BC Managed Care – PPO

## 2020-04-07 ENCOUNTER — Other Ambulatory Visit: Payer: Self-pay

## 2020-04-07 ENCOUNTER — Encounter: Payer: Self-pay | Admitting: Podiatry

## 2020-04-07 ENCOUNTER — Ambulatory Visit: Payer: BC Managed Care – PPO | Admitting: Podiatry

## 2020-04-07 DIAGNOSIS — M722 Plantar fascial fibromatosis: Secondary | ICD-10-CM | POA: Diagnosis not present

## 2020-04-08 ENCOUNTER — Encounter: Payer: Self-pay | Admitting: Podiatry

## 2020-04-08 NOTE — Progress Notes (Signed)
  Subjective:  Patient ID: Rebecca Phelps, female    DOB: [birth year],  MRN: [id number]  Chief Complaint  Patient presents with  . Foot Pain    Patient presents today for bilat heel pain x years, right worse than left    45 y.o. female presents with the above complaint.  Patient presents with complaint bilateral plantar fasciitis.  Has been going for a number years right worse than left.  She states is painful to touch.  It flared up about 1 to 2 years ago painful with worse in the morning and at bedtime.  She is tried night splint stretching in the past ibuprofen none of which has helped.  She would like to discuss treatment options she has not seen anyone else prior to seeing me for this.  She denies any other acute complaints.   Review of Systems: Negative except as noted in the HPI. Denies N/V/F/Ch.  Past Medical History:  Diagnosis Date  . Anxiety   . B12 deficiency   . Depression   . GERD (gastroesophageal reflux disease)   . Hyperlipidemia   . Hypertension   . Pre-diabetes 2019    Current Outpatient Medications:  .  Biotin 1000 MCG CHEW, Chew by mouth., Disp: , Rfl:  .  vitamin B-12 (CYANOCOBALAMIN) 1000 MCG tablet, Take 1,000 mcg by mouth daily., Disp: , Rfl:  .  metoprolol succinate (TOPROL-XL) 25 MG 24 hr tablet, Take 25 mg by mouth daily. , Disp: , Rfl:  .  omeprazole (PRILOSEC) 20 MG capsule, Take 20 mg by mouth daily., Disp: , Rfl:   Social History   Tobacco Use  Smoking Status Former Smoker  . Quit date: 2006  . Years since quitting: 16.1  Smokeless Tobacco Never Used    Allergies  Allergen Reactions  . Sulfa Antibiotics Rash   Objective:  There were no vitals filed for this visit. There is no height or weight on file to calculate BMI. Constitutional Well developed. Well nourished.  Vascular Dorsalis pedis pulses palpable bilaterally. Posterior tibial pulses palpable bilaterally. Capillary refill normal to all digits.  No cyanosis or clubbing  noted. Pedal hair growth normal.  Neurologic Normal speech. Oriented to person, place, and time. Epicritic sensation to light touch grossly present bilaterally.  Dermatologic Nails well groomed and normal in appearance. No open wounds. No skin lesions.  Orthopedic: Normal joint ROM without pain or crepitus bilaterally. No visible deformities. Tender to palpation at the calcaneal tuber bilaterally. No pain with calcaneal squeeze bilaterally. Ankle ROM diminished range of motion bilaterally. Silfverskiold Test: positive bilaterally.   Radiographs: Taken and reviewed. No acute fractures or dislocations. No evidence of stress fracture.  Plantar heel spur present. Posterior heel spur present.   Assessment:   1. Plantar fasciitis of right foot   2. Plantar fasciitis of left foot    Plan:  Patient was evaluated and treated and all questions answered.  Plantar Fasciitis, bilaterally - XR reviewed as above.  - Educated on icing and stretching. Instructions given.  - Injection delivered to the plantar fascia as below. - DME: Plantar Fascial Brace - Pharmacologic management: None  Procedure: Injection Tendon/Ligament Location: Left plantar fascia at the glabrous junction; medial approach. Skin Prep: alcohol Injectate: 0.5 cc 0.5% marcaine plain, 0.5 cc of 1% Lidocaine, 0.5 cc kenalog 10. Disposition: Patient tolerated procedure well. Injection site dressed with a band-aid.  No follow-ups on file.

## 2020-05-05 ENCOUNTER — Encounter: Payer: BC Managed Care – PPO | Admitting: Podiatry

## 2020-05-26 ENCOUNTER — Other Ambulatory Visit: Payer: Self-pay | Admitting: Family Medicine

## 2020-05-26 ENCOUNTER — Other Ambulatory Visit (HOSPITAL_COMMUNITY): Payer: Self-pay | Admitting: Family Medicine

## 2020-05-26 DIAGNOSIS — R1011 Right upper quadrant pain: Secondary | ICD-10-CM

## 2020-06-29 ENCOUNTER — Ambulatory Visit
Admission: RE | Admit: 2020-06-29 | Discharge: 2020-06-29 | Disposition: A | Discharge status: Home / Self Care | Payer: BC Managed Care – PPO | Source: Ambulatory Visit | Attending: Family Medicine | Admitting: Family Medicine

## 2020-06-29 ENCOUNTER — Other Ambulatory Visit: Payer: Self-pay

## 2020-06-29 DIAGNOSIS — R1011 Right upper quadrant pain: Secondary | ICD-10-CM | POA: Insufficient documentation

## 2020-07-05 ENCOUNTER — Ambulatory Visit: Payer: Self-pay | Admitting: General Surgery

## 2020-07-05 NOTE — H&P (Signed)
PATIENT PROFILE: Rebecca Phelps is a 45 y.o. female who presents to the Clinic for consultation at the request of Dr. Kary Kos for evaluation of polyp of the gallbladder.   PCP:  Lovie Macadamia, MD  HISTORY OF PRESENT ILLNESS: Rebecca Phelps reports she has been having right upper quadrant pain since a year ago.  She reported the right upper quadrant pain has been slowly getting more frequent.  The pain on the right lower quadrant does not radiate to the part of body.  The pain exacerbated by eating.  There is no alleviating factors.  Patient denies fever or chills.  Patient takes Prilosec for GERD without any change in her right upper quadrant intensity or frequency.  Upon evaluation of the right upper quadrant pain she had ultrasound done.  The ultrasound shows a 5 mm gallbladder polyp.  There was no stone or sludge.  I personally evaluated the images.  Patient was referred to surgery for further recommendations.   PROBLEM LIST: Problem List  Date Reviewed: 05/25/2020         Noted   Essential hypertension 10/27/2019   Gastroesophageal reflux disease without esophagitis 10/27/2019   Anxiety and depression 09/09/2014      GENERAL REVIEW OF SYSTEMS:   General ROS: negative for - chills, fatigue, fever, weight gain or weight loss Allergy and Immunology ROS: negative for - hives  Hematological and Lymphatic ROS: negative for - bleeding problems or bruising, negative for palpable nodes Endocrine ROS: negative for - heat or cold intolerance, hair changes Respiratory ROS: negative for - cough, shortness of breath or wheezing Cardiovascular ROS: no chest pain or palpitations GI ROS: negative for nausea, vomiting, diarrhea, constipation.  Positive for abdominal pain. Musculoskeletal ROS: negative for - joint swelling or muscle pain Neurological ROS: negative for - confusion, syncope Dermatological ROS: negative for pruritus and rash Psychiatric: negative for anxiety, depression, difficulty  sleeping and memory loss  MEDICATIONS: Current Outpatient Medications  Medication Sig Dispense Refill  . cyanocobalamin (VITAMIN B12) 1000 MCG tablet Take 1,000 mcg by mouth once daily.    . metoprolol succinate (TOPROL-XL) 25 MG XL tablet Take 1 tablet (25 mg total) by mouth once daily 90 tablet 3  . omeprazole (PRILOSEC) 20 MG DR capsule Take 1 capsule (20 mg total) by mouth once daily (Patient taking differently: Take 20 mg by mouth as needed) 90 capsule 3   No current facility-administered medications for this visit.    ALLERGIES: Sulfa (sulfonamide antibiotics)  PAST MEDICAL HISTORY: Past Medical History:  Diagnosis Date  . Anxiety   . COVID-19 09/2019  . Depression   . Hypertension     PAST SURGICAL HISTORY: Past Surgical History:  Procedure Laterality Date  . [place], 2001   2 c sections  . HYSTERECTOMY     Partial  . TUBAL LIGATION  2001     FAMILY HISTORY: Family History  Problem Relation Age of Onset  . High blood pressure (Hypertension) Mother   . Glaucoma Mother   . Sarcoidosis Father   . High blood pressure (Hypertension) Father   . Prostate cancer Father   . Skin cancer Father   . Valvular heart disease Father        Aortic valve replacement  . Heart disease Father   . Osteoarthritis Father   . Cancer Maternal Grandmother        Bile duct  . Depression Maternal Grandmother   . Myocardial Infarction (Heart attack) Maternal Grandfather 70  .  Stroke Maternal Grandfather   . Sudden cardiac death Maternal Grandfather   . Diabetes type II Paternal Grandmother   . Breast cancer Paternal Grandmother   . Diabetes type II Paternal Grandfather   . Diabetes Paternal Grandfather   . Obesity Paternal Grandfather   . No Known Problems Brother   . Diabetes type I Son   . No Known Problems Son   . Anxiety Maternal Aunt      SOCIAL HISTORY: Social History   Socioeconomic History  . Marital status: Married  Occupational History  .  Occupation: Tree surgeon  Tobacco Use  . Smoking status: Former Smoker    Packs/day: 0.50    Types: Cigarettes    Start date: 08/19/1993    Quit date: 08/20/2003    Years since quitting: 16.8  . Smokeless tobacco: Never Used  Vaping Use  . Vaping Use: Never used  Substance and Sexual Activity  . Alcohol use: Not Currently  . Drug use: No  . Sexual activity: Yes    Partners: Male    Birth control/protection: Surgical, OCP    PHYSICAL EXAM: Vitals:   07/05/20 0959  BP: (!) 144/85  Pulse: 64   Body mass index is 38.41 kg/m. Weight: (!) 108 kg (238 lb)   GENERAL: Alert, active, oriented x3  HEENT: Pupils equal reactive to light. Extraocular movements are intact. Sclera clear. Palpebral conjunctiva normal red color.Pharynx clear.  NECK: Supple with no palpable mass and no adenopathy.  LUNGS: Sound clear with no rales rhonchi or wheezes.  HEART: Regular rhythm S1 and S2 without murmur.  ABDOMEN: Soft and depressible, nontender with no palpable mass, no hepatomegaly.   EXTREMITIES: Well-developed well-nourished symmetrical with no dependent edema.  NEUROLOGICAL: Awake alert oriented, facial expression symmetrical, moving all extremities.  REVIEW OF DATA: I have reviewed the following data today: Initial consult on 07/05/2020  Component Date Value  . WBC (White Blood Cell Co* 07/05/2020 7.5   . RBC (Red Blood Cell Coun* 07/05/2020 4.65   . Hemoglobin 07/05/2020 13.8   . Hematocrit 07/05/2020 40.6   . MCV (Mean Corpuscular Vo* 07/05/2020 87.3   . MCH (Mean Corpuscular He* 07/05/2020 29.7   . MCHC (Mean Corpuscular H* 07/05/2020 34.0   . Platelet Count 07/05/2020 190   . RDW-CV (Red Cell Distrib* 07/05/2020 12.7   . MPV (Mean Platelet Volum* 07/05/2020 11.0   . Neutrophils 07/05/2020 4.70   . Lymphocytes 07/05/2020 2.28   . Monocytes 07/05/2020 0.40   . Eosinophils 07/05/2020 0.08   . Basophils 07/05/2020 0.03   . Neutrophil % 07/05/2020 62.7    . Lymphocyte % 07/05/2020 30.4   . Monocyte % 07/05/2020 5.3   . Eosinophil % 07/05/2020 1.1   . Basophil% 07/05/2020 0.4   . Immature Granulocyte % 07/05/2020 0.1   . Immature Granulocyte Cou* 07/05/2020 0.01   . Glucose 07/05/2020 89   . Sodium 07/05/2020 139   . Potassium 07/05/2020 4.4   . Chloride 07/05/2020 102   . Carbon Dioxide (CO2) 07/05/2020 31.9   . Urea Nitrogen (BUN) 07/05/2020 12   . Creatinine 07/05/2020 0.8   . Glomerular Filtration Ra* 07/05/2020 77   . Calcium 07/05/2020 9.4   . AST  07/05/2020 23   . ALT  07/05/2020 33   . Alk Phos (alkaline Phosp* 07/05/2020 77   . Albumin 07/05/2020 4.1   . Bilirubin, Total 07/05/2020 0.6   . Protein, Total 07/05/2020 6.5   . A/G Ratio 07/05/2020 1.7  ASSESSMENT: Ms. Tropea is a 45 y.o. female presenting for consultation for polyp of the gallbladder.    Patient was oriented about the diagnosis of gallbladder polyp. Also oriented about what is the gallbladder, its anatomy and function and the implications of having a 5 mm polyp in the gallbladder.  I had a long discussion with the patient that the finding of a 5 mm polyp by itself is not concerning of malignancy.  It is not indicated to have cholecystectomy when patient has symptoms consistent with biliary colic.  I considered that her right upper quadrant pain exacerbated by eating is consistent with biliary colic.  The patient was oriented about the treatment alternatives (observation vs cholecystectomy). Patient was oriented that a low percentage of patient will continue to have similar pain symptoms even after the gallbladder is removed. Surgical technique (open vs laparoscopic) was discussed. It was also discussed the goals of the surgery (decrease the pain episodes and avoid the risk of cholecystitis) and the risk of surgery including: bleeding, infection, common bile duct injury, stone retention, injury to other organs such as bowel, liver, stomach, other complications such  as hernia, bowel obstruction among others. Also discussed with patient about anesthesia and its complications such as: reaction to medications, pneumonia, heart complications, death, among others.   Polyp of gallbladder [K82.4]  PLAN: 1.  Robotic assisted laparoscopic cholecystectomy (30322) 2.  CBC, CMP 3.  Do not take aspirin 5 days before the procedure 4.  Contact us if has any question or concern.   Patient verbalized understanding, all questions were answered, and were agreeable with the plan outlined above.    Herbert Pun, MD  Electronically signed by Herbert Pun, MD

## 2020-08-01 ENCOUNTER — Other Ambulatory Visit
Admission: RE | Admit: 2020-08-01 | Discharge: 2020-08-01 | Disposition: A | Discharge status: Home / Self Care | Payer: BC Managed Care – PPO | Source: Ambulatory Visit | Attending: General Surgery | Admitting: General Surgery

## 2020-08-01 NOTE — Patient Instructions (Signed)
Your procedure is scheduled on:  Friday, June 10 Report to the Registration Desk on the 1st floor of the [facility]. To find out your arrival time, please call [phone] between 1PM - 3PM on: Thursday, June 9  REMEMBER: Instructions that are not followed completely may result in serious medical risk, up to and including death; or upon the discretion of your surgeon and anesthesiologist your surgery may need to be rescheduled.  Do not eat or drink anything after midnight the night before surgery.  No gum chewing, lozengers or hard candies.  TAKE THESE MEDICATIONS THE MORNING OF SURGERY WITH A SIP OF WATER:  1.  Metoprolol 2.  Omeprazole - (take one the night before and one on the morning of surgery - helps to prevent nausea after surgery.)  One week prior to surgery: Stop Anti-inflammatories (NSAIDS) such as Advil, Aleve, Ibuprofen, Motrin, Naproxen, Naprosyn and Aspirin based products such as Excedrin, Goodys Powder, BC Powder. Stop ANY OVER THE COUNTER supplements until after surgery. You may however, continue to take Tylenol if needed for pain up until the day of surgery.  No Alcohol for 24 hours before or after surgery.  No Smoking including e-cigarettes for 24 hours prior to surgery.  No chewable tobacco products for at least 6 hours prior to surgery.  No nicotine patches on the day of surgery.  Do not use any "recreational" drugs for at least a week prior to your surgery.  Please be advised that the combination of cocaine and anesthesia may have negative outcomes, up to and including death. If you test positive for cocaine, your surgery will be cancelled.  On the morning of surgery brush your teeth with toothpaste and water, you may rinse your mouth with mouthwash if you wish. Do not swallow any toothpaste or mouthwash.  Do not wear jewelry, make-up, hairpins, clips or nail polish.  Do not wear lotions, powders, or perfumes.   Do not shave body from the neck down 48  hours prior to surgery just in case you cut yourself which could leave a site for infection.  Also, freshly shaved skin may become irritated if using the CHG soap.  Contact lenses, hearing aids and dentures may not be worn into surgery.  Do not bring valuables to the hospital. [facility] is not responsible for any missing/lost belongings or valuables.   Use CHG Soap as directed on instruction sheet.  Bring your C-PAP to the hospital with you in case you may have to spend the night.   Notify your doctor if there is any change in your medical condition (cold, fever, infection).  Wear comfortable clothing (specific to your surgery type) to the hospital.  Plan for stool softeners for home use; pain medications have a tendency to cause constipation. You can also help prevent constipation by eating foods high in fiber such as fruits and vegetables and drinking plenty of fluids as your diet allows.  After surgery, you can help prevent lung complications by doing breathing exercises.  Take deep breaths and cough every 1-2 hours. Your doctor may order a device called an Incentive Spirometer to help you take deep breaths. When coughing or sneezing, hold a pillow firmly against your incision with both hands. This is called "splinting." Doing this helps protect your incision. It also decreases belly discomfort.  If you are being discharged the day of surgery, you will not be allowed to drive home. You will need a responsible adult (18 years or older) to drive you  home and stay with you that night.   If you are taking public transportation, you will need to have a responsible adult (18 years or older) with you. Please confirm with your physician that it is acceptable to use public transportation.   Please call the [place] [facility] at [phone] if you have any questions about these instructions.  Surgery Visitation Policy:  Patients undergoing a surgery or procedure may have one  family member or support person with them as long as that person is not COVID-19 positive or experiencing its symptoms.  That person may remain in the waiting area during the procedure.

## 2020-08-01 NOTE — Progress Notes (Signed)
Call to patient to do preop interview. Stated that she was no longer going to have this surgery and she had called Dr. Windell Moment office about cancelling 3 weeks ago. Patient is still on [facility] surgery schedule. Call to Dr. Windell Moment office to inform them of this.

## 2020-08-05 ENCOUNTER — Ambulatory Visit: Admit: 2020-08-05 | Payer: BC Managed Care – PPO | Admitting: General Surgery

## 2020-08-05 SURGERY — CHOLECYSTECTOMY, ROBOT-ASSISTED, LAPAROSCOPIC
Anesthesia: General | Site: Abdomen

## 2020-11-11 ENCOUNTER — Other Ambulatory Visit: Payer: Self-pay | Admitting: Family Medicine

## 2020-11-11 DIAGNOSIS — Z1231 Encounter for screening mammogram for malignant neoplasm of breast: Secondary | ICD-10-CM

## 2020-11-28 ENCOUNTER — Other Ambulatory Visit: Payer: Self-pay

## 2020-11-28 ENCOUNTER — Ambulatory Visit
Admission: RE | Admit: 2020-11-28 | Discharge: 2020-11-28 | Disposition: A | Discharge status: Home / Self Care | Payer: BC Managed Care – PPO | Source: Ambulatory Visit | Attending: Family Medicine | Admitting: Family Medicine

## 2020-11-28 DIAGNOSIS — Z1231 Encounter for screening mammogram for malignant neoplasm of breast: Secondary | ICD-10-CM | POA: Insufficient documentation

## 2021-06-29 ENCOUNTER — Ambulatory Visit
Admission: RE | Admit: 2021-06-29 | Discharge: 2021-06-29 | Disposition: A | Discharge status: Home / Self Care | Payer: BC Managed Care – PPO | Attending: Surgery | Admitting: Surgery

## 2021-06-29 ENCOUNTER — Encounter
Admission: RE | Disposition: A | Discharge status: Home / Self Care | Payer: Self-pay | Source: Home / Self Care | Attending: Surgery

## 2021-06-29 ENCOUNTER — Ambulatory Visit: Payer: BC Managed Care – PPO | Admitting: Certified Registered"

## 2021-06-29 ENCOUNTER — Encounter: Payer: Self-pay | Admitting: Surgery

## 2021-06-29 DIAGNOSIS — K219 Gastro-esophageal reflux disease without esophagitis: Secondary | ICD-10-CM | POA: Diagnosis not present

## 2021-06-29 DIAGNOSIS — K824 Cholesterolosis of gallbladder: Secondary | ICD-10-CM | POA: Diagnosis not present

## 2021-06-29 DIAGNOSIS — K760 Fatty (change of) liver, not elsewhere classified: Secondary | ICD-10-CM | POA: Diagnosis not present

## 2021-06-29 DIAGNOSIS — E669 Obesity, unspecified: Secondary | ICD-10-CM | POA: Diagnosis not present

## 2021-06-29 DIAGNOSIS — Z87891 Personal history of nicotine dependence: Secondary | ICD-10-CM | POA: Diagnosis not present

## 2021-06-29 DIAGNOSIS — R1013 Epigastric pain: Secondary | ICD-10-CM | POA: Diagnosis not present

## 2021-06-29 DIAGNOSIS — K317 Polyp of stomach and duodenum: Secondary | ICD-10-CM | POA: Insufficient documentation

## 2021-06-29 DIAGNOSIS — I10 Essential (primary) hypertension: Secondary | ICD-10-CM | POA: Insufficient documentation

## 2021-06-29 DIAGNOSIS — Z6834 Body mass index (BMI) 34.0-34.9, adult: Secondary | ICD-10-CM | POA: Insufficient documentation

## 2021-06-29 HISTORY — PX: ESOPHAGOGASTRODUODENOSCOPY: SHX5428

## 2021-06-29 SURGERY — EGD (ESOPHAGOGASTRODUODENOSCOPY)
Anesthesia: General

## 2021-06-29 MED ORDER — PROPOFOL 500 MG/50ML IV EMUL
INTRAVENOUS | Status: DC | PRN
  Administered 2021-06-29: 145 ug/kg/min via INTRAVENOUS
  Administered 2021-06-29: 155 ug/kg/min via INTRAVENOUS

## 2021-06-29 MED ORDER — SODIUM CHLORIDE 0.9 % IV SOLN
INTRAVENOUS | Status: DC
  Administered 2021-06-29: 1000 mL via INTRAVENOUS

## 2021-06-29 MED ORDER — PROPOFOL 500 MG/50ML IV EMUL: INTRAVENOUS | Status: DC | PRN

## 2021-06-29 MED ORDER — GLYCOPYRROLATE 0.2 MG/ML IJ SOLN
INTRAMUSCULAR | Status: DC | PRN
Start: 2021-06-29 — End: 2021-06-29
  Administered 2021-06-29: .2 mg via INTRAVENOUS

## 2021-06-29 MED ORDER — LIDOCAINE HCL (CARDIAC) PF 100 MG/5ML IV SOSY
PREFILLED_SYRINGE | INTRAVENOUS | Status: DC | PRN
  Administered 2021-06-29: 100 mg via INTRAVENOUS

## 2021-06-29 MED ORDER — MIDAZOLAM HCL 2 MG/2ML IJ SOLN
INTRAMUSCULAR | Status: DC | PRN
  Administered 2021-06-29: 2 mg via INTRAVENOUS

## 2021-06-29 MED ORDER — PROPOFOL 10 MG/ML IV BOLUS
INTRAVENOUS | Status: DC | PRN
  Administered 2021-06-29: 70 mg via INTRAVENOUS
  Administered 2021-06-29: 10 mg via INTRAVENOUS
  Administered 2021-06-29: 20 mg via INTRAVENOUS

## 2021-06-29 MED ORDER — MIDAZOLAM HCL 2 MG/2ML IJ SOLN
INTRAMUSCULAR | Status: AC
  Filled 2021-06-29: qty 2

## 2021-06-29 NOTE — Anesthesia Preprocedure Evaluation (Addendum)
Anesthesia Evaluation  ?Patient identified by MRN, date of birth, ID band ?Patient awake ? ? ? ?Reviewed: ?Allergy & Precautions, NPO status , Patient's Chart, lab work & pertinent test results ? ?History of Anesthesia Complications ?Negative for: history of anesthetic complications ? ?Airway ?Mallampati: II ? ? ?Neck ROM: Full ? ? ? Dental ?no notable dental hx. ? ?  ?Pulmonary ?former smoker (quit 2006),  ?  ?Pulmonary exam normal ?breath sounds clear to auscultation ? ? ? ? ? ? Cardiovascular ?Exercise Tolerance: Good ?hypertension, Normal cardiovascular exam ?Rhythm:Regular Rate:Normal ? ? ?  ?Neuro/Psych ?PSYCHIATRIC DISORDERS Anxiety Depression negative neurological ROS ?   ? GI/Hepatic ?GERD  ,Fatty liver ?  ?Endo/Other  ?Obesity  ? Renal/GU ?negative Renal ROS  ? ?  ?Musculoskeletal ? ? Abdominal ?  ?Peds ? Hematology ?negative hematology ROS ?(+)   ?Anesthesia Other Findings ? ? Reproductive/Obstetrics ? ?  ? ? ? ? ? ? ? ? ? ? ? ? ? ?  ?  ? ? ? ? ? ? ? ?Anesthesia Physical ?Anesthesia Plan ? ?ASA: 2 ? ?Anesthesia Plan: General  ? ?Post-op Pain Management:   ? ?Induction: Intravenous ? ?PONV Risk Score and Plan: 3 and Propofol infusion, TIVA and Treatment may vary due to age or medical condition ? ?Airway Management Planned: Natural Airway ? ?Additional Equipment:  ? ?Intra-op Plan:  ? ?Post-operative Plan:  ? ?Informed Consent: I have reviewed the patients History and Physical, chart, labs and discussed the procedure including the risks, benefits and alternatives for the proposed anesthesia with the patient or authorized representative who has indicated his/her understanding and acceptance.  ? ? ? ? ? ?Plan Discussed with: CRNA ? ?Anesthesia Plan Comments: (LMA/GETA backup discussed.  Patient consented for risks of anesthesia including but not limited to:  ?- adverse reactions to medications ?- damage to eyes, teeth, lips or other oral mucosa ?- nerve damage due to  positioning  ?- sore throat or hoarseness ?- damage to heart, brain, nerves, lungs, other parts of body or loss of life ? ?Informed patient about role of CRNA in peri- and intra-operative care.  Patient voiced understanding.)  ? ? ? ? ? ? ?Anesthesia Quick Evaluation ? ?

## 2021-06-29 NOTE — H&P (Signed)
HISTORY OF PRESENT ILLNESS:  ? ?Rebecca Phelps is a 46 y.o.female patient who comes for reevaluation of right upper quadrant pain gallbladder polyp. ? ?Patient previously evaluated on May 2022 due to right upper quadrant pain and 5 mm polyp identified on the gallbladder. Patient endorses that her pain is mostly on the right upper quadrant. Pain radiates to the epigastric area. Sometimes pains feels like burning. He feels very different to her heartburn. Patient take Prilosec for heartburn with significant control of her symptoms. Sometime she needs Tums. Right upper current pain is aggravated by eating. There has been alleviating factors. ? ?Patient had an ultrasound of the abdomen that shows a 5 mm polyp, no stones were appreciated. I personally evaluated the images. ? ?At that time patient was offered to have surgery with indication of gallbladder polyp with biliary colic's. She elected not to proceed with the surgery to try to change her diet and find other alternatives to see if she can control her pain without surgery. ? ?She called back recently endorses that her symptoms have not improved significantly and she would like to consider surgical management. ? ? ?PAST MEDICAL HISTORY:  ?Past Medical History:  ?Diagnosis Date  ? Anxiety  ? COVID-19 09/2019  ? Depression  ? Hypertension  ? ? ? ?PAST SURGICAL HISTORY:  ?Past Surgical History:  ?Procedure Laterality Date  ? TUBAL LIGATION 2001  ? [place], 2001  ?2 c sections  ? HYSTERECTOMY  ?Partial  ? ? ?MEDICATIONS:  ?Outpatient Encounter Medications as of 06/23/2021  ?Medication Sig Dispense Refill  ? cyanocobalamin (VITAMIN B12) 1000 MCG tablet Take 1,000 mcg by mouth once daily.  ? olmesartan (BENICAR) 20 MG tablet Take 1 tablet (20 mg total) by mouth once daily 90 tablet 3  ? omeprazole (PRILOSEC) 20 MG DR capsule TAKE 1 CAPSULE (20 MG TOTAL) BY MOUTH ONCE DAILY 90 capsule 1  ? semaglutide (OZEMPIC) 1 mg/dose (4 mg/3 mL) pen injector Inject 0.75 mLs (1  mg total) subcutaneously once a week 3 mL 2  ? ?No facility-administered encounter medications on file as of 06/23/2021.  ? ? ?ALLERGIES:  ?Sulfa (sulfonamide antibiotics) ? ?SOCIAL HISTORY:  ?Social History  ? ?Socioeconomic History  ? Marital status: Married  ?Occupational History  ? Occupation: Tree surgeon  ?Tobacco Use  ? Smoking status: Former  ?Packs/day: 0.50  ?Types: Cigarettes  ?Start date: 08/19/1993  ?Quit date: 08/20/2003  ?Years since quitting: 17.8  ? Smokeless tobacco: Never  ?Vaping Use  ? Vaping Use: Never used  ?Substance and Sexual Activity  ? Alcohol use: Not Currently  ? Drug use: No  ? Sexual activity: Yes  ?Partners: Male  ?Birth control/protection: Surgical, OCP  ? ?FAMILY HISTORY:  ?Family History  ?Problem Relation Age of Onset  ? High blood pressure (Hypertension) Mother  ? Glaucoma Mother  ? Sarcoidosis Father  ? High blood pressure (Hypertension) Father  ? Prostate cancer Father  ? Skin cancer Father  ? Valvular heart disease Father  ?Aortic valve replacement  ? Heart disease Father  ? Osteoarthritis Father  ? Cancer Maternal Grandmother  ?Bile duct  ? Depression Maternal Grandmother  ? Myocardial Infarction (Heart attack) Maternal Grandfather 70  ? Stroke Maternal Grandfather  ? Sudden cardiac death Maternal Grandfather  ? Diabetes type II Paternal Grandmother  ? Breast cancer Paternal Grandmother  ? Diabetes type II Paternal Grandfather  ? Diabetes Paternal Grandfather  ? Obesity Paternal Grandfather  ? No Known Problems Brother  ?  Diabetes type I Son  ? No Known Problems Son  ? Anxiety Maternal Aunt  ? ? ?GENERAL REVIEW OF SYSTEMS:  ? ?General ROS: negative for - chills, fatigue, fever, weight gain or weight loss ?Allergy and Immunology ROS: negative for - hives  ?Hematological and Lymphatic ROS: negative for - bleeding problems or bruising, negative for palpable nodes ?Endocrine ROS: negative for - heat or cold intolerance, hair changes ?Respiratory ROS: negative for  - cough, shortness of breath or wheezing ?Cardiovascular ROS: no chest pain or palpitations ?GI ROS: negative for nausea, vomiting, diarrhea, constipation. Positive for abdominal pain ?Musculoskeletal ROS: negative for - joint swelling or muscle pain ?Neurological ROS: negative for - confusion, syncope ?Dermatological ROS: negative for pruritus and rash ? ?PHYSICAL EXAM:  ?Vitals:  ?06/23/21 0957  ?BP: (!) 140/93  ?Pulse: 67  ?.  ?Ht:170.2 cm ('5\' 7"'$ ) Wt:(!) 104.8 kg (231 lb) MEB:RAXE surface area is 2.23 meters squared. ?Body mass index is 36.18 kg/m?Marland Kitchen. ? ?GENERAL: Alert, active, oriented x3 ? ?HEENT: Pupils equal reactive to light. Extraocular movements are intact. Sclera clear. Palpebral conjunctiva normal red color.Pharynx clear. ? ?NECK: Supple with no palpable mass and no adenopathy. ? ?LUNGS: Sound clear with no rales rhonchi or wheezes. ? ?HEART: Regular rhythm S1 and S2 without murmur. ? ?ABDOMEN: Soft and depressible, nontender with no palpable mass, no hepatomegaly.  ? ?EXTREMITIES: Well-developed well-nourished symmetrical with no dependent edema. ? ?NEUROLOGICAL: Awake alert oriented, facial expression symmetrical, moving all extremities. ? ? ?IMPRESSION:  ? ?Polyp of gallbladder [K82.4] ? ?We discussed again about the implication however 5 mm gallbladder polyp. Usually these incidental findings does not need surgical management but the fact that she is having right upper quadrant pain could be a biliary colic and cholecystectomy can be beneficial to improve her pain. Again discussed with patient that there is a possibility that her pain does not improve if the gallbladder polyp is not the cause of her pain but there is no further testing that can point specifically where the pain is coming from. We also discussed about the alternative of having an upper endoscopy to rule out any gastric or duodenal causes of epigastric and right upper current pain such as gastritis or peptic ulcer disease. The patient  reported that she would like to avoid surgery if possible so she would like to proceed with upper endoscopy. I discussed the case with Dr. Lysle Pearl who will perform the upper endoscopy. If there is any significant findings on the upper endoscopy he should be treated first to see if this helps to improve her symptoms. If the upper endoscopy does not shows any explanation for her pain we will discuss about proceeding with cholecystectomy.  ? ?PLAN:  ?1. Will proceed with upper endoscopy for evaluation of upper GI tract as the cause of your epigastric pain ?2. If no pathology identified in upper endoscopy, will discuss about cholecystectomy.  ?3. Contact us if you have any concern.  ? ?Patient verbalized understanding, all questions were answered, and were agreeable with the plan outlined above.  ? ?

## 2021-06-29 NOTE — Transfer of Care (Signed)
Immediate Anesthesia Transfer of Care Note ? ?Patient: Rebecca Phelps ? ?Procedure(s) Performed: ESOPHAGOGASTRODUODENOSCOPY (EGD) ? ?Patient Location: Endoscopy Unit ? ?Anesthesia Type:General ? ?Level of Consciousness: awake, drowsy and patient cooperative ? ?Airway & Oxygen Therapy: Patient Spontanous Breathing and Patient connected to face mask oxygen ? ?Post-op Assessment: Report given to RN and Post -op Vital signs reviewed and stable ? ?Post vital signs: Reviewed and stable ? ?Last Vitals:  ?Vitals Value Taken Time  ?BP 122/82 06/29/21 1311  ?Temp 36.4 ?C 06/29/21 1311  ?Pulse 103 06/29/21 1311  ?Resp 17 06/29/21 1311  ?SpO2 100 % 06/29/21 1311  ? ? ?Last Pain:  ?Vitals:  ? 06/29/21 1311  ?TempSrc: Temporal  ?PainSc: 0-No pain  ?   ? ?  ? ?Complications: No notable events documented. ?

## 2021-06-29 NOTE — Anesthesia Procedure Notes (Signed)
Procedure Name: General with mask airway ?Date/Time: 06/29/2021 1:00 PM ?Performed by: Kelton Pillar, CRNA ?Pre-anesthesia Checklist: Patient identified, Emergency Drugs available, Suction available and Patient being monitored ?Patient Re-evaluated:Patient Re-evaluated prior to induction ?Oxygen Delivery Method: Simple face mask ?Induction Type: IV induction ?Placement Confirmation: positive ETCO2, CO2 detector and breath sounds checked- equal and bilateral ?Dental Injury: Teeth and Oropharynx as per pre-operative assessment  ? ? ? ? ?

## 2021-06-29 NOTE — Op Note (Signed)
[facility] ?Gastroenterology ?Patient Name: Rebecca Phelps ?Procedure Date: 06/29/2021 10:59 AM ?MRN: [id number] ?Account #: [id number] ?Date of Birth: 05/13/1975 ?Admit Type: Outpatient ?Age: 46 ?Room: [facility] ENDO ROOM 3 ?Gender: Female ?Note Status: Finalized ?Instrument Name: Upper Endoscope [id number] ?Procedure:             Upper GI endoscopy ?Indications:           Epigastric abdominal pain ?Providers:             Eliseo Squires MD, MD ?Referring MD:          Irven Easterly. Kary Kos, MD (Referring MD) ?Medicines:             Propofol per Anesthesia ?Complications:         No immediate complications. ?Procedure:             Pre-Anesthesia Assessment: ?                       - After reviewing the risks and benefits, the patient  ?                       was deemed in satisfactory condition to undergo the  ?                       procedure in an ambulatory setting. ?                       After obtaining informed consent, the endoscope was  ?                       passed under direct vision. Throughout the procedure,  ?                       the patient's blood pressure, pulse, and oxygen  ?                       saturations were monitored continuously. The Endoscope  ?                       was introduced through the mouth, and advanced to the  ?                       second part of duodenum. The upper GI endoscopy was  ?                       accomplished without difficulty. The patient tolerated  ?                       the procedure well. ?Findings: ?     Multiple 2 to 4 mm semi-sessile polyps with no bleeding and no stigmata  ?     of recent bleeding were found in the gastric body. Biopsies were taken  ?     with a cold forceps for histology. Estimated blood loss was minimal. ?     The second portion of the duodenum was normal. ?     The examined esophagus was normal. ?Impression:            - Multiple gastric polyps. Biopsied. ?                       -  Normal second portion of the duodenum. ?                        - Normal esophagus. ?Recommendation:        - Discharge patient to home. ?                       - Resume previous diet. ?                       - Written discharge instructions were provided to the  ?                       patient. ?                       - Await pathology results. ?Procedure Code(s):     --- Professional --- ?                       [phone], Esophagogastroduodenoscopy, flexible,  ?                       transoral; with biopsy, single or multiple ?Diagnosis Code(s):     --- Professional --- ?                       R10.13, Epigastric pain ?                       K31.7, Polyp of stomach and duodenum ?CPT copyright 2019 [employer]. All rights reserved. ?The codes documented in this report are preliminary and upon coder review may  ?be revised to meet current compliance requirements. ?Dr. Sheppard Penton, MD ?Eliseo Squires MD, MD ?06/29/2021 1:12:42 PM ?This report has been signed electronically. ?Number of Addenda: 0 ?Note Initiated On: 06/29/2021 10:59 AM ?Estimated Blood Loss:  Estimated blood loss was minimal. ?     [facility] ?

## 2021-06-30 ENCOUNTER — Encounter: Payer: Self-pay | Admitting: Surgery

## 2021-06-30 NOTE — Anesthesia Postprocedure Evaluation (Signed)
Anesthesia Post Note ? ?Patient: Rebecca Phelps ? ?Procedure(s) Performed: ESOPHAGOGASTRODUODENOSCOPY (EGD) ? ?Patient location during evaluation: PACU ?Anesthesia Type: General ?Level of consciousness: awake and alert, oriented and patient cooperative ?Pain management: pain level controlled ?Vital Signs Assessment: post-procedure vital signs reviewed and stable ?Respiratory status: spontaneous breathing, nonlabored ventilation and respiratory function stable ?Cardiovascular status: blood pressure returned to baseline and stable ?Postop Assessment: adequate PO intake ?Anesthetic complications: no ? ? ?No notable events documented. ? ? ?Last Vitals:  ?Vitals:  ? 06/29/21 1331 06/29/21 1341  ?BP: (!) 132/92 (!) 134/93  ?Pulse: 77 78  ?Resp: 14 14  ?Temp:    ?SpO2: 100% 100%  ?  ?Last Pain:  ?Vitals:  ? 06/29/21 1341  ?TempSrc:   ?PainSc: 0-No pain  ? ? ?  ?  ?  ?  ?  ?  ? ?Darrin Nipper ? ? ? ? ?

## 2021-07-03 LAB — SURGICAL PATHOLOGY

## 2021-09-25 ENCOUNTER — Inpatient Hospital Stay
Admission: RE | Admit: 2021-09-25 | Discharge: 2021-09-25 | Disposition: A | Discharge status: Home / Self Care | Payer: BC Managed Care – PPO | Source: Ambulatory Visit

## 2021-09-25 ENCOUNTER — Ambulatory Visit: Payer: Self-pay | Admitting: General Surgery

## 2021-09-25 NOTE — Patient Instructions (Signed)
Your procedure is scheduled on: Monday October 02, 2021. Report to Day Surgery inside [facility] 2nd floor, stop by admissions desk before getting on elevator.  To find out your arrival time please call [phone] between 1PM - 3PM on Friday September 29, 2021.  Remember: Instructions that are not followed completely may result in serious medical risk,  up to and including death, or upon the discretion of your surgeon and anesthesiologist your  surgery may need to be rescheduled.     _X__ 1. Do not eat food or drink fluids after midnight the night before your procedure.                 No chewing gum or hard candies. .  __X__2.  On the morning of surgery brush your teeth with toothpaste and water, you                may rinse your mouth with mouthwash if you wish.  Do not swallow any toothpaste or mouthwash.     _X__ 3.  No Alcohol for 24 hours before or after surgery.   _X__ 4.  Do Not Smoke or use e-cigarettes For 24 Hours Prior to Your Surgery.                 Do not use any chewable tobacco products for at least 6 hours prior to                 Surgery.  _X__  5.  Do not use any recreational drugs (marijuana, cocaine, heroin, ecstasy, MDMA or other)                For at least one week prior to your surgery.  Combination of these drugs with anesthesia                May have life threatening results.  ____  6.  Bring all medications with you on the day of surgery if instructed.   __X__  7.  Notify your doctor if there is any change in your medical condition      (cold, fever, infections).     Do not wear jewelry, make-up, hairpins, clips or nail polish. Do not wear lotions, powders, or perfumes. You may wear deodorant. Do not shave 48 hours prior to surgery. Men may shave face and neck. Do not bring valuables to the hospital.    [facility] is not responsible for any belongings or valuables.  Contacts, dentures or bridgework may not be worn into  surgery. Leave your suitcase in the car. After surgery it may be brought to your room. For patients admitted to the hospital, discharge time is determined by your treatment team.   Patients discharged the day of surgery will not be allowed to drive home.   Make arrangements for someone to be with you for the first 24 hours of your Same Day Discharge.   __X__ Take these medicines the morning of surgery with A SIP OF WATER:    1. metoprolol succinate (TOPROL-XL) 25 MG 24 hr tablet  2. omeprazole (PRILOSEC) 20 MG capsule Take one the night before and one the morning of surgery.  3.   4.  5.  6.  ____ Fleet Enema (as directed)   __X__ Use CHG Soap (or wipes) as directed  ____ Use Benzoyl Peroxide Gel as instructed  ____ Use inhalers on the day of surgery  ____ Stop metformin 2 days prior to surgery  ____ Take 1/2 of usual insulin dose the night before surgery. No insulin the morning          of surgery.   ____ Call your PCP, cardiologist, or Pulmonologist if taking Coumadin/Plavix/aspirin and ask when to stop before your surgery.   __X__ One Week prior to surgery- Stop Anti-inflammatories such as Ibuprofen, Aleve, Advil, Motrin, meloxicam (MOBIC), diclofenac, etodolac, ketorolac, Toradol, Daypro, piroxicam, Goody's or BC powders. OK TO USE TYLENOL IF NEEDED   __X__ Stop ALL vitamins and or supplements until after surgery.    ____ Bring C-Pap to the hospital.    If you have any questions regarding your pre-procedure instructions,  Please call Pre-admit Testing at [phone]

## 2021-10-02 ENCOUNTER — Ambulatory Visit: Admission: RE | Admit: 2021-10-02 | Payer: BC Managed Care – PPO | Source: Home / Self Care | Admitting: General Surgery

## 2021-10-02 ENCOUNTER — Encounter: Admission: RE | Payer: Self-pay | Source: Home / Self Care

## 2021-10-02 SURGERY — CHOLECYSTECTOMY, ROBOT-ASSISTED, LAPAROSCOPIC
Anesthesia: General | Site: Abdomen

## 2021-12-29 ENCOUNTER — Other Ambulatory Visit: Payer: Self-pay | Admitting: Family Medicine

## 2021-12-29 DIAGNOSIS — Z1231 Encounter for screening mammogram for malignant neoplasm of breast: Secondary | ICD-10-CM

## 2022-02-13 ENCOUNTER — Ambulatory Visit
Admission: RE | Admit: 2022-02-13 | Discharge: 2022-02-13 | Disposition: A | Discharge status: Home / Self Care | Payer: BC Managed Care – PPO | Source: Ambulatory Visit | Attending: Family Medicine | Admitting: Family Medicine

## 2022-02-13 DIAGNOSIS — Z1231 Encounter for screening mammogram for malignant neoplasm of breast: Secondary | ICD-10-CM | POA: Insufficient documentation

## 2022-02-22 IMAGING — MG DIGITAL SCREENING BILAT W/ TOMO W/ CAD
8 series · 8 of 24 positions shown · non-contrast
Comparison: Previous exam(s).

ACR Breast Density Category a: The breast tissue is almost entirely
fatty.

CLINICAL DATA: Screening.

EXAM:
DIGITAL SCREENING BILATERAL MAMMOGRAM WITH TOMO AND CAD

[R CC synth-2D]
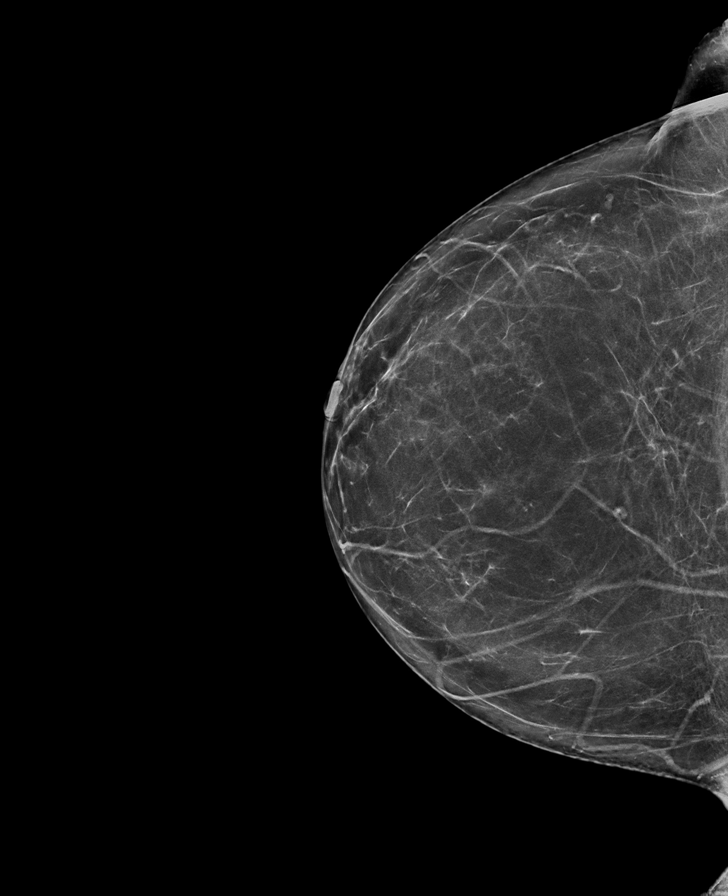

[L CC synth-2D]
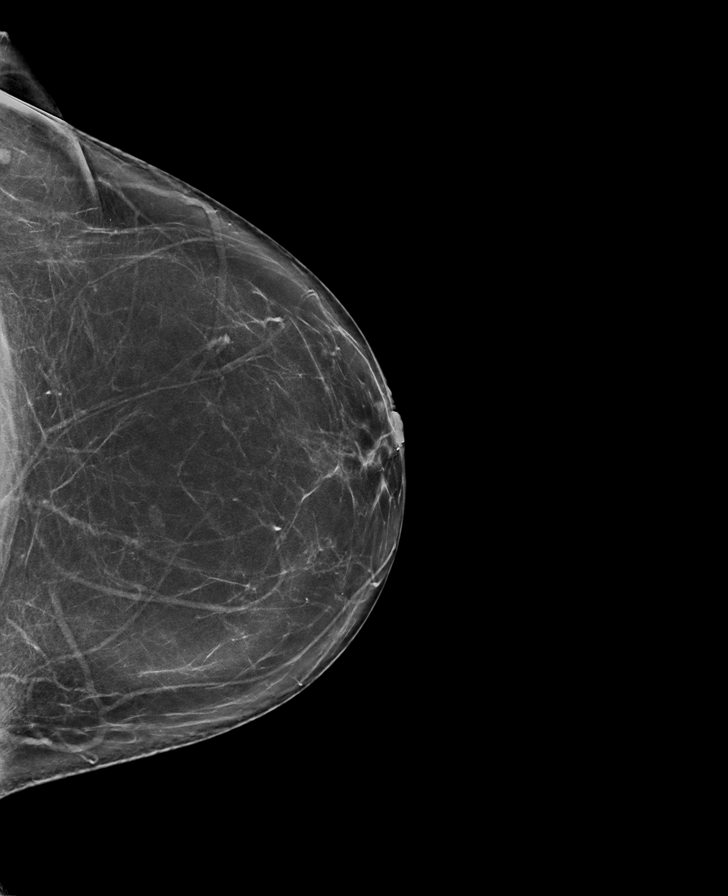

[L MLO synth-2D]
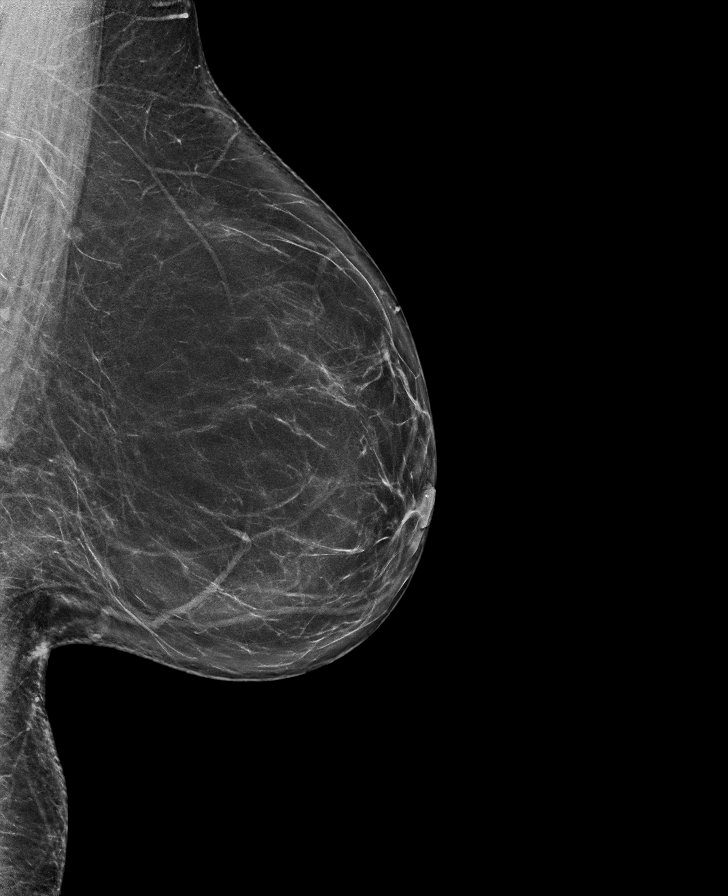

[R MLO synth-2D]
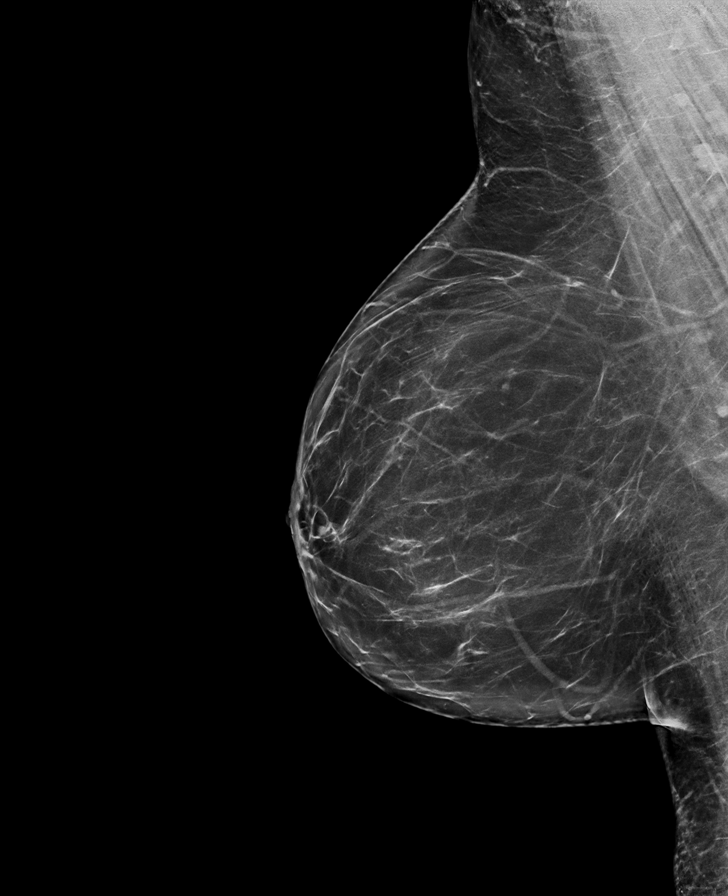

[R MLO tomo · tomo slice 45/88.0]
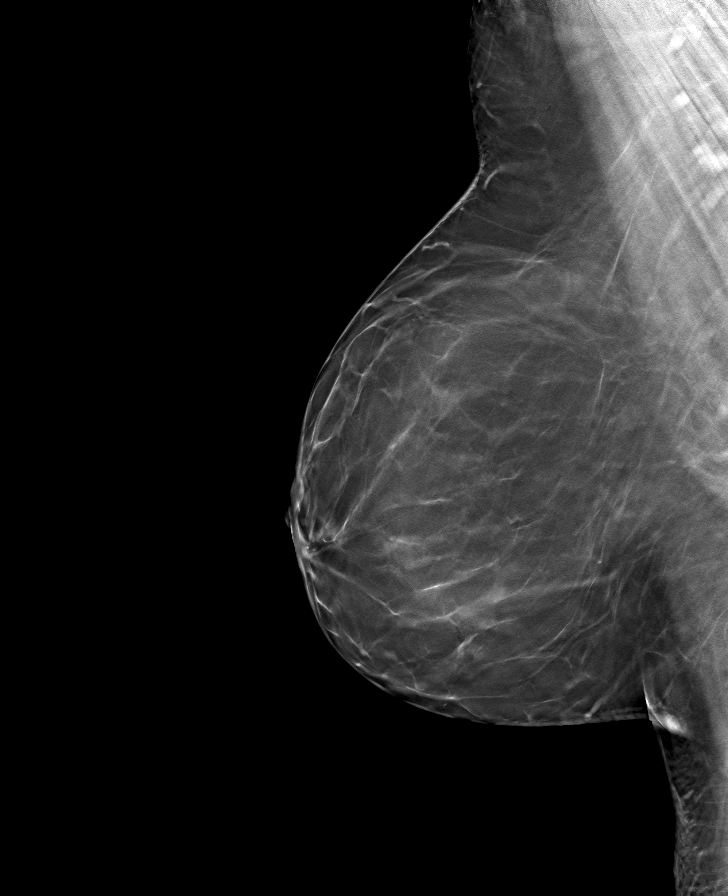

[R CC tomo · tomo slice 37/74.0]
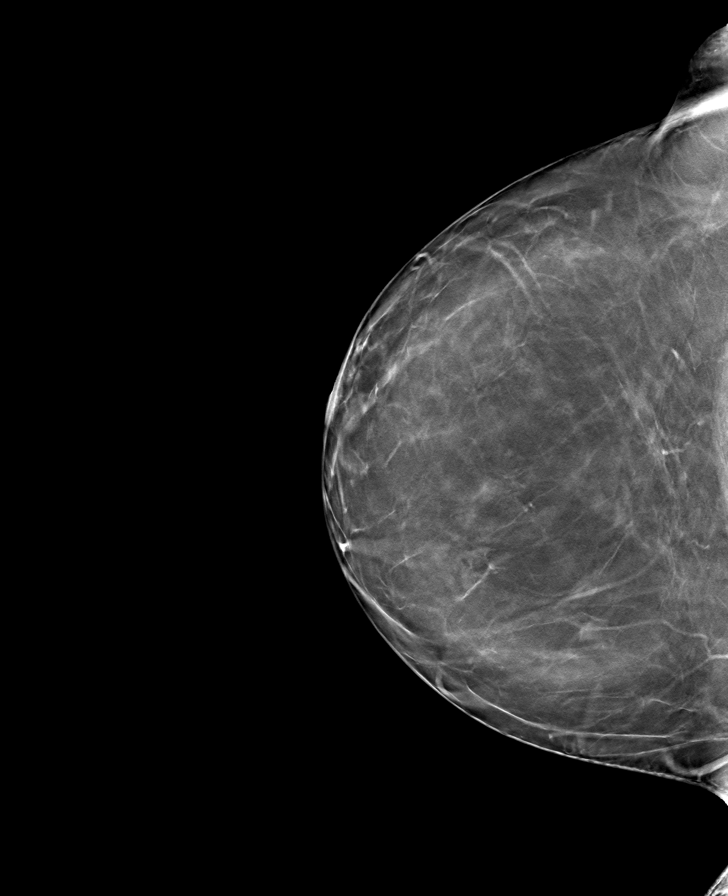

[L MLO tomo · tomo slice 41/80.0]
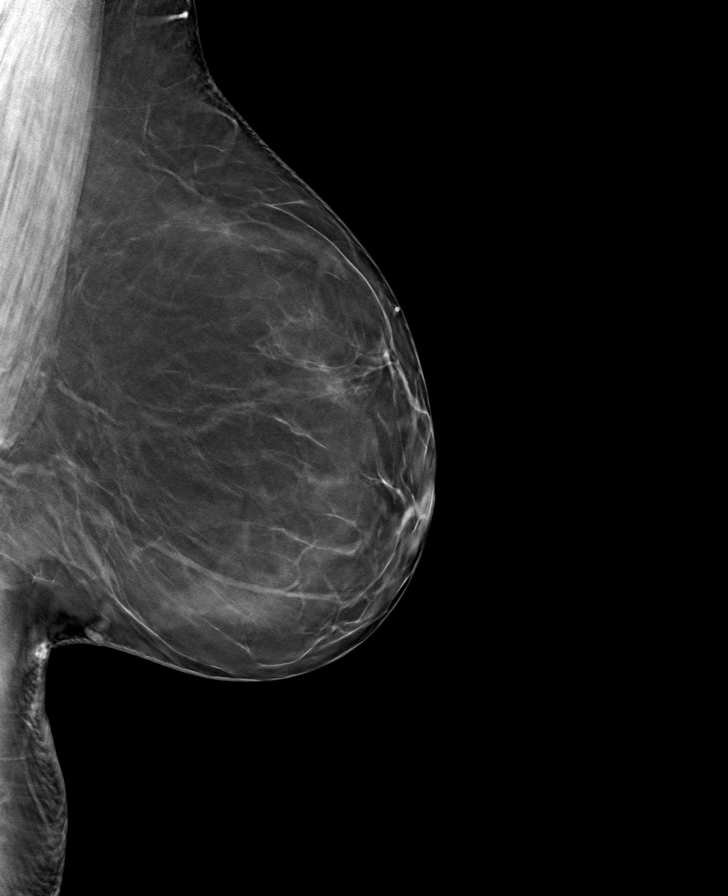

[L CC tomo · tomo slice 41/81.0]
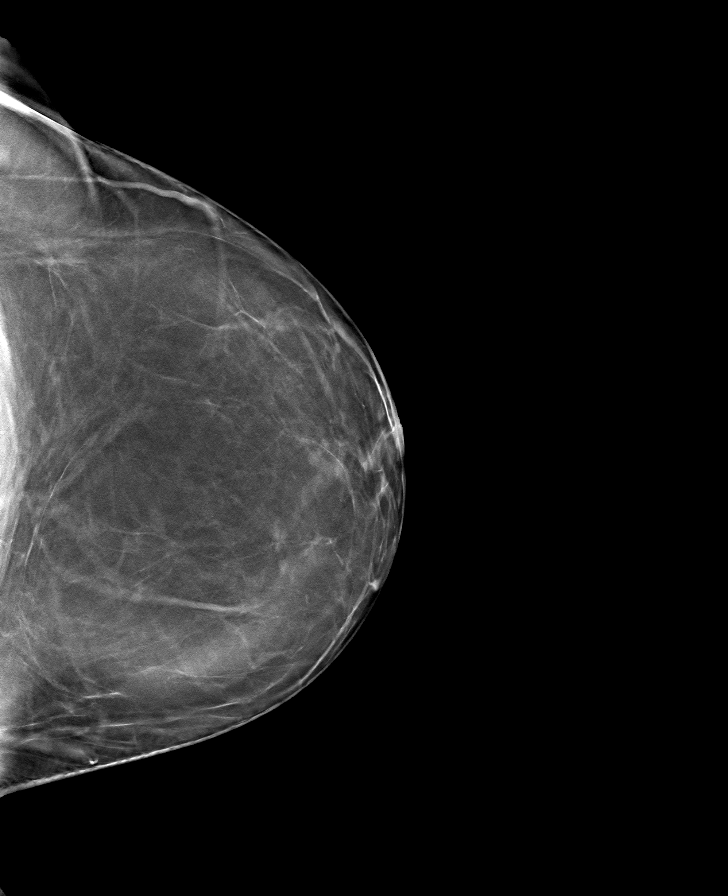

[8 of 24 positions shown; findings below may reference images not displayed]

FINDINGS: There are no findings suspicious for malignancy. Images were
processed with CAD.
IMPRESSION: No mammographic evidence of malignancy. A result letter of this
screening mammogram will be mailed directly to the patient.

RECOMMENDATION:
Screening mammogram in one year. (Code:[id number])

BI-RADS CATEGORY  1: Negative.

## 2023-01-21 ENCOUNTER — Other Ambulatory Visit: Payer: Self-pay | Admitting: Family Medicine

## 2023-01-21 DIAGNOSIS — Z1231 Encounter for screening mammogram for malignant neoplasm of breast: Secondary | ICD-10-CM

## 2023-02-21 ENCOUNTER — Ambulatory Visit
Admission: RE | Admit: 2023-02-21 | Discharge: 2023-02-21 | Disposition: A | Discharge status: Home / Self Care | Payer: BC Managed Care – PPO | Source: Ambulatory Visit | Attending: Family Medicine | Admitting: Family Medicine

## 2023-02-21 DIAGNOSIS — Z1231 Encounter for screening mammogram for malignant neoplasm of breast: Secondary | ICD-10-CM | POA: Diagnosis present

## 2024-02-03 ENCOUNTER — Other Ambulatory Visit: Payer: Self-pay | Admitting: Family Medicine

## 2024-02-03 DIAGNOSIS — Z1231 Encounter for screening mammogram for malignant neoplasm of breast: Secondary | ICD-10-CM

## 2024-02-18 ENCOUNTER — Other Ambulatory Visit: Payer: Self-pay | Admitting: Medical Genetics

## 2024-02-21 ENCOUNTER — Other Ambulatory Visit
Admission: RE | Admit: 2024-02-21 | Discharge: 2024-02-21 | Disposition: A | Discharge status: Home / Self Care | Payer: Self-pay | Source: Ambulatory Visit | Attending: Medical Genetics | Admitting: Medical Genetics

## 2024-02-25 ENCOUNTER — Ambulatory Visit
Admission: RE | Admit: 2024-02-25 | Discharge: 2024-02-25 | Disposition: A | Discharge status: Home / Self Care | Payer: Self-pay | Source: Ambulatory Visit | Attending: Family Medicine | Admitting: Family Medicine

## 2024-02-25 DIAGNOSIS — Z1231 Encounter for screening mammogram for malignant neoplasm of breast: Secondary | ICD-10-CM | POA: Diagnosis present

## 2024-03-06 LAB — GENECONNECT MOLECULAR SCREEN: Genetic Analysis Overall Interpretation: NEGATIVE
